# Patient Record
Sex: Female | Born: 1950
Health system: Southern US, Community
[De-identification: ages and names within clinical notes are randomized; demographics above are authoritative.]

## PROBLEM LIST (undated history)

## (undated) DIAGNOSIS — R0789 Other chest pain: Secondary | ICD-10-CM

## (undated) DIAGNOSIS — R0609 Other forms of dyspnea: Secondary | ICD-10-CM

## (undated) DIAGNOSIS — E079 Disorder of thyroid, unspecified: Secondary | ICD-10-CM

## (undated) DIAGNOSIS — E785 Hyperlipidemia, unspecified: Secondary | ICD-10-CM

## (undated) DIAGNOSIS — F419 Anxiety disorder, unspecified: Secondary | ICD-10-CM

## (undated) DIAGNOSIS — Z8719 Personal history of other diseases of the digestive system: Secondary | ICD-10-CM

## (undated) DIAGNOSIS — E559 Vitamin D deficiency, unspecified: Secondary | ICD-10-CM

## (undated) DIAGNOSIS — K219 Gastro-esophageal reflux disease without esophagitis: Secondary | ICD-10-CM

## (undated) DIAGNOSIS — D696 Thrombocytopenia, unspecified: Secondary | ICD-10-CM

## (undated) DIAGNOSIS — K5790 Diverticulosis of intestine, part unspecified, without perforation or abscess without bleeding: Secondary | ICD-10-CM

## (undated) DIAGNOSIS — F32A Depression, unspecified: Secondary | ICD-10-CM

## (undated) DIAGNOSIS — M810 Age-related osteoporosis without current pathological fracture: Secondary | ICD-10-CM

## (undated) DIAGNOSIS — I10 Essential (primary) hypertension: Secondary | ICD-10-CM

## (undated) DIAGNOSIS — E039 Hypothyroidism, unspecified: Secondary | ICD-10-CM

## (undated) DIAGNOSIS — I2510 Atherosclerotic heart disease of native coronary artery without angina pectoris: Secondary | ICD-10-CM

## (undated) DIAGNOSIS — F329 Major depressive disorder, single episode, unspecified: Secondary | ICD-10-CM

## (undated) DIAGNOSIS — E538 Deficiency of other specified B group vitamins: Secondary | ICD-10-CM

## (undated) DIAGNOSIS — M1711 Unilateral primary osteoarthritis, right knee: Secondary | ICD-10-CM

## (undated) DIAGNOSIS — E063 Autoimmune thyroiditis: Secondary | ICD-10-CM

## (undated) HISTORY — PX: ABDOMINAL HYSTERECTOMY: SHX81

## (undated) HISTORY — DX: Depression, unspecified: F32.A

## (undated) HISTORY — PX: TONSILLECTOMY: SUR1361

## (undated) HISTORY — PX: OOPHORECTOMY: SHX86

## (undated) HISTORY — DX: Anxiety disorder, unspecified: F41.9

## (undated) HISTORY — DX: Age-related osteoporosis without current pathological fracture: M81.0

## (undated) HISTORY — PX: TUBAL LIGATION: SHX77

## (undated) HISTORY — PX: COLONOSCOPY: SHX174

## (undated) HISTORY — DX: Hyperlipidemia, unspecified: E78.5

## (undated) HISTORY — DX: Gastro-esophageal reflux disease without esophagitis: K21.9

## (undated) HISTORY — DX: Disorder of thyroid, unspecified: E07.9

## (undated) HISTORY — DX: Major depressive disorder, single episode, unspecified: F32.9

---
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
## 1979-06-25 HISTORY — PX: TUBAL LIGATION: SHX77

## 1999-12-02 HISTORY — PX: TOTAL ABDOMINAL HYSTERECTOMY W/ BILATERAL SALPINGOOPHORECTOMY: SHX83

## 2002-12-01 DIAGNOSIS — E785 Hyperlipidemia, unspecified: Secondary | ICD-10-CM

## 2002-12-01 HISTORY — DX: Hyperlipidemia, unspecified: E78.5

## 2004-12-18 ENCOUNTER — Ambulatory Visit: Payer: Self-pay | Admitting: Internal Medicine

## 2005-02-07 ENCOUNTER — Ambulatory Visit: Payer: Self-pay | Admitting: Internal Medicine

## 2005-02-14 ENCOUNTER — Ambulatory Visit: Payer: Self-pay

## 2005-02-14 HISTORY — PX: COLONOSCOPY: SHX174

## 2006-02-17 ENCOUNTER — Ambulatory Visit: Payer: Self-pay | Admitting: Internal Medicine

## 2007-02-25 ENCOUNTER — Ambulatory Visit: Payer: Self-pay | Admitting: Internal Medicine

## 2008-04-13 ENCOUNTER — Ambulatory Visit: Payer: Self-pay | Admitting: Internal Medicine

## 2008-09-15 ENCOUNTER — Ambulatory Visit: Payer: Self-pay | Admitting: Internal Medicine

## 2008-09-29 ENCOUNTER — Ambulatory Visit: Payer: Self-pay | Admitting: Internal Medicine

## 2009-04-25 ENCOUNTER — Ambulatory Visit: Payer: Self-pay | Admitting: Internal Medicine

## 2010-04-29 ENCOUNTER — Ambulatory Visit: Payer: Self-pay | Admitting: Internal Medicine

## 2011-04-14 ENCOUNTER — Ambulatory Visit: Payer: Self-pay | Admitting: Cardiology

## 2011-05-02 ENCOUNTER — Ambulatory Visit: Payer: Self-pay | Admitting: Internal Medicine

## 2012-05-04 ENCOUNTER — Ambulatory Visit: Payer: Self-pay | Admitting: Internal Medicine

## 2013-05-11 ENCOUNTER — Ambulatory Visit: Payer: Self-pay | Admitting: Internal Medicine

## 2014-05-13 DIAGNOSIS — F419 Anxiety disorder, unspecified: Secondary | ICD-10-CM | POA: Insufficient documentation

## 2014-05-13 DIAGNOSIS — F32A Depression, unspecified: Secondary | ICD-10-CM | POA: Insufficient documentation

## 2014-05-13 DIAGNOSIS — E063 Autoimmune thyroiditis: Secondary | ICD-10-CM | POA: Insufficient documentation

## 2014-05-13 DIAGNOSIS — E039 Hypothyroidism, unspecified: Secondary | ICD-10-CM | POA: Insufficient documentation

## 2014-05-13 DIAGNOSIS — M810 Age-related osteoporosis without current pathological fracture: Secondary | ICD-10-CM | POA: Insufficient documentation

## 2014-05-13 DIAGNOSIS — I10 Essential (primary) hypertension: Secondary | ICD-10-CM | POA: Insufficient documentation

## 2014-05-13 DIAGNOSIS — R0789 Other chest pain: Secondary | ICD-10-CM | POA: Insufficient documentation

## 2014-05-13 DIAGNOSIS — E785 Hyperlipidemia, unspecified: Secondary | ICD-10-CM | POA: Insufficient documentation

## 2014-06-23 ENCOUNTER — Ambulatory Visit: Payer: Self-pay | Admitting: Internal Medicine

## 2015-07-30 ENCOUNTER — Other Ambulatory Visit: Payer: Self-pay | Admitting: Internal Medicine

## 2015-07-30 DIAGNOSIS — Z1231 Encounter for screening mammogram for malignant neoplasm of breast: Secondary | ICD-10-CM

## 2015-07-31 ENCOUNTER — Ambulatory Visit
Admission: RE | Admit: 2015-07-31 | Discharge: 2015-07-31 | Disposition: A | Discharge status: Home / Self Care | Payer: Self-pay | Source: Ambulatory Visit | Attending: Internal Medicine | Admitting: Internal Medicine

## 2015-07-31 DIAGNOSIS — Z1231 Encounter for screening mammogram for malignant neoplasm of breast: Secondary | ICD-10-CM | POA: Insufficient documentation

## 2016-07-25 ENCOUNTER — Other Ambulatory Visit: Payer: Self-pay | Admitting: Internal Medicine

## 2016-07-25 DIAGNOSIS — Z1231 Encounter for screening mammogram for malignant neoplasm of breast: Secondary | ICD-10-CM

## 2016-08-08 ENCOUNTER — Ambulatory Visit: Payer: Self-pay

## 2016-08-15 ENCOUNTER — Ambulatory Visit
Admission: RE | Admit: 2016-08-15 | Discharge: 2016-08-15 | Disposition: A | Discharge status: Home / Self Care | Payer: BLUE CROSS/BLUE SHIELD | Source: Ambulatory Visit | Attending: Internal Medicine | Admitting: Internal Medicine

## 2016-08-15 DIAGNOSIS — Z1231 Encounter for screening mammogram for malignant neoplasm of breast: Secondary | ICD-10-CM

## 2017-09-11 ENCOUNTER — Other Ambulatory Visit: Payer: Self-pay | Admitting: Internal Medicine

## 2017-09-11 DIAGNOSIS — M810 Age-related osteoporosis without current pathological fracture: Secondary | ICD-10-CM | POA: Diagnosis not present

## 2017-09-11 DIAGNOSIS — Z79899 Other long term (current) drug therapy: Secondary | ICD-10-CM | POA: Diagnosis not present

## 2017-09-11 DIAGNOSIS — K219 Gastro-esophageal reflux disease without esophagitis: Secondary | ICD-10-CM | POA: Diagnosis not present

## 2017-09-11 DIAGNOSIS — E063 Autoimmune thyroiditis: Secondary | ICD-10-CM | POA: Diagnosis not present

## 2017-09-11 DIAGNOSIS — E038 Other specified hypothyroidism: Secondary | ICD-10-CM | POA: Diagnosis not present

## 2017-09-11 DIAGNOSIS — E039 Hypothyroidism, unspecified: Secondary | ICD-10-CM | POA: Diagnosis not present

## 2017-09-11 DIAGNOSIS — F411 Generalized anxiety disorder: Secondary | ICD-10-CM | POA: Diagnosis not present

## 2017-09-11 DIAGNOSIS — Z1231 Encounter for screening mammogram for malignant neoplasm of breast: Secondary | ICD-10-CM | POA: Diagnosis not present

## 2017-09-11 DIAGNOSIS — E782 Mixed hyperlipidemia: Secondary | ICD-10-CM | POA: Diagnosis not present

## 2017-09-11 DIAGNOSIS — I10 Essential (primary) hypertension: Secondary | ICD-10-CM | POA: Diagnosis not present

## 2017-09-11 DIAGNOSIS — Z1211 Encounter for screening for malignant neoplasm of colon: Secondary | ICD-10-CM | POA: Diagnosis not present

## 2017-10-09 ENCOUNTER — Ambulatory Visit
Admission: RE | Admit: 2017-10-09 | Discharge: 2017-10-09 | Disposition: A | Discharge status: Home / Self Care | Payer: PPO | Source: Ambulatory Visit | Attending: Internal Medicine | Admitting: Internal Medicine

## 2017-10-09 DIAGNOSIS — Z1231 Encounter for screening mammogram for malignant neoplasm of breast: Secondary | ICD-10-CM | POA: Insufficient documentation

## 2017-10-30 DIAGNOSIS — R591 Generalized enlarged lymph nodes: Secondary | ICD-10-CM | POA: Diagnosis not present

## 2018-01-19 DIAGNOSIS — H25813 Combined forms of age-related cataract, bilateral: Secondary | ICD-10-CM | POA: Diagnosis not present

## 2018-01-29 DIAGNOSIS — Z1211 Encounter for screening for malignant neoplasm of colon: Secondary | ICD-10-CM | POA: Diagnosis not present

## 2018-01-29 DIAGNOSIS — D696 Thrombocytopenia, unspecified: Secondary | ICD-10-CM | POA: Diagnosis not present

## 2018-02-10 DIAGNOSIS — L728 Other follicular cysts of the skin and subcutaneous tissue: Secondary | ICD-10-CM | POA: Diagnosis not present

## 2018-02-12 ENCOUNTER — Inpatient Hospital Stay: Payer: PPO | Attending: Oncology | Admitting: Oncology

## 2018-02-12 ENCOUNTER — Other Ambulatory Visit: Payer: Self-pay

## 2018-02-12 ENCOUNTER — Inpatient Hospital Stay: Payer: PPO

## 2018-02-12 ENCOUNTER — Encounter: Payer: Self-pay | Admitting: Oncology

## 2018-02-12 VITALS — BP 125/85 | HR 69 | Temp 96.9°F | Resp 18 | Ht 67.72 in | Wt 170.1 lb

## 2018-02-12 DIAGNOSIS — D696 Thrombocytopenia, unspecified: Secondary | ICD-10-CM

## 2018-02-12 DIAGNOSIS — F329 Major depressive disorder, single episode, unspecified: Secondary | ICD-10-CM | POA: Diagnosis not present

## 2018-02-12 DIAGNOSIS — E079 Disorder of thyroid, unspecified: Secondary | ICD-10-CM | POA: Diagnosis not present

## 2018-02-12 DIAGNOSIS — F419 Anxiety disorder, unspecified: Secondary | ICD-10-CM | POA: Insufficient documentation

## 2018-02-12 DIAGNOSIS — K219 Gastro-esophageal reflux disease without esophagitis: Secondary | ICD-10-CM | POA: Insufficient documentation

## 2018-02-12 DIAGNOSIS — E785 Hyperlipidemia, unspecified: Secondary | ICD-10-CM | POA: Diagnosis not present

## 2018-02-12 DIAGNOSIS — Z79899 Other long term (current) drug therapy: Secondary | ICD-10-CM | POA: Diagnosis not present

## 2018-02-12 LAB — CBC WITH DIFFERENTIAL/PLATELET
BASOS PCT: 1 %
Basophils Absolute: 0 10*3/uL (ref 0–0.1)
Eosinophils Absolute: 0.1 10*3/uL (ref 0–0.7)
Eosinophils Relative: 2 %
HEMATOCRIT: 40.3 % (ref 35.0–47.0)
Hemoglobin: 13.7 g/dL (ref 12.0–16.0)
LYMPHS PCT: 36 %
Lymphs Abs: 1.7 10*3/uL (ref 1.0–3.6)
MCH: 30.2 pg (ref 26.0–34.0)
MCHC: 33.9 g/dL (ref 32.0–36.0)
MCV: 89 fL (ref 80.0–100.0)
MONO ABS: 0.3 10*3/uL (ref 0.2–0.9)
MONOS PCT: 7 %
NEUTROS ABS: 2.6 10*3/uL (ref 1.4–6.5)
Neutrophils Relative %: 54 %
Platelets: 142 10*3/uL — ABNORMAL LOW (ref 150–440)
RBC: 4.52 MIL/uL (ref 3.80–5.20)
RDW: 14.1 % (ref 11.5–14.5)
WBC: 4.8 10*3/uL (ref 3.6–11.0)

## 2018-02-12 LAB — LACTATE DEHYDROGENASE: LDH: 143 U/L (ref 98–192)

## 2018-02-12 LAB — COMPREHENSIVE METABOLIC PANEL
ALBUMIN: 4.1 g/dL (ref 3.5–5.0)
ALK PHOS: 66 U/L (ref 38–126)
ALT: 22 U/L (ref 14–54)
ANION GAP: 8 (ref 5–15)
AST: 20 U/L (ref 15–41)
BUN: 25 mg/dL — ABNORMAL HIGH (ref 6–20)
CALCIUM: 9.4 mg/dL (ref 8.9–10.3)
CO2: 27 mmol/L (ref 22–32)
Chloride: 105 mmol/L (ref 101–111)
Creatinine, Ser: 0.82 mg/dL (ref 0.44–1.00)
GFR calc Af Amer: >60 mL/min (ref >60)
GFR calc non Af Amer: >60 mL/min (ref >60)
GLUCOSE: 85 mg/dL (ref 65–99)
Potassium: 3.6 mmol/L (ref 3.5–5.1)
SODIUM: 140 mmol/L (ref 135–145)
Total Bilirubin: 1 mg/dL (ref 0.3–1.2)
Total Protein: 7.3 g/dL (ref 6.5–8.1)

## 2018-02-12 LAB — FOLATE: FOLATE: 10.7 ng/mL (ref >5.9)

## 2018-02-12 LAB — VITAMIN B12: VITAMIN B 12: 142 pg/mL — AB (ref 180–914)

## 2018-02-12 LAB — TSH: TSH: 0.018 u[IU]/mL — ABNORMAL LOW (ref 0.350–4.500)

## 2018-02-12 NOTE — Progress Notes (Signed)
Here for new pt evaluation.  

## 2018-02-13 LAB — HEPATITIS PANEL, ACUTE
HCV Ab: <0.1 s/co ratio (ref 0.0–0.9)
HEP B C IGM: NEGATIVE
Hep A IgM: NEGATIVE
Hepatitis B Surface Ag: NEGATIVE

## 2018-02-13 LAB — HIV ANTIBODY (ROUTINE TESTING W REFLEX): HIV Screen 4th Generation wRfx: NONREACTIVE

## 2018-02-14 ENCOUNTER — Encounter: Payer: Self-pay | Admitting: Oncology

## 2018-02-14 NOTE — Progress Notes (Addendum)
Hematology/Oncology Consult note [facility] Telephone:([phone] Fax:([phone]   Patient Care Team: Idelle Crouch, MD as PCP - General (Internal Medicine)  REFERRING PROVIDER: Idelle Crouch, MD CHIEF COMPLAINTS/PURPOSE OF CONSULTATION:  Evaluation of low platelet.   HISTORY OF PRESENTING ILLNESS:  Audrey Finley is a  67 y.o.  female with PMH listed below who was referred to me for evaluation of thrombocytopenia. Patient recently had lab work done which revealed thrombocytopenia, platelet counts 146,000.  Reviewed patient's previous labs, his platelet count was 125,000 in march 2018, 129,0000 to 138,000 from 2016-2018 .  Patient denies fatigue, weight loss, easy bruising, hematochezia, hemoptysis  She denies fatigue, weight loss, easy bruising, hematochezia, hemoptysis.  Alcohol Use: socially 2 glassed of wine per week.     Review of Systems  Constitutional: Negative for chills, fever, malaise/fatigue and weight loss.  HENT: Negative for nosebleeds.   Eyes: Negative for blurred vision and double vision.  Respiratory: Negative for cough and sputum production.   Cardiovascular: Negative for chest pain and palpitations.  Gastrointestinal: Negative for abdominal pain, blood in stool, nausea and vomiting.  Genitourinary: Negative for dysuria, frequency and urgency.  Musculoskeletal: Negative for myalgias and neck pain.  Skin: Negative for rash.  Neurological: Negative for dizziness, tremors and headaches.  Endo/Heme/Allergies: Does not bruise/bleed easily.  Psychiatric/Behavioral: Negative for depression. The patient is not nervous/anxious.     MEDICAL HISTORY:  Past Medical History:  Diagnosis Date  . Anxiety   . Depression   . GERD (gastroesophageal reflux disease)   . Hyperlipidemia 2004  . Osteoporosis   . Thyroid disease     SURGICAL HISTORY: Past Surgical History:  Procedure Laterality Date  . ABDOMINAL HYSTERECTOMY       SOCIAL HISTORY: Social History   Socioeconomic History  . Marital status: Married    Spouse name: Not on file  . Number of children: Not on file  . Years of education: Not on file  . Highest education level: Not on file  Social Needs  . Financial resource strain: Not on file  . Food insecurity - worry: Not on file  . Food insecurity - inability: Not on file  . Transportation needs - medical: Not on file  . Transportation needs - non-medical: Not on file  Occupational History  . Not on file  Tobacco Use  . Smoking status: Never Smoker  . Smokeless tobacco: Never Used  Substance and Sexual Activity  . Alcohol use: Yes    Comment: socially -2 glasses of wine per wk  . Drug use: No  . Sexual activity: Yes    Birth control/protection: Spermicide  Other Topics Concern  . Not on file  Social History Narrative  . Not on file    FAMILY HISTORY: Family History  Problem Relation Age of Onset  . Diabetes Mother   . Coronary artery disease Mother   . CVA Mother   . Coronary artery disease Father   . Kidney disease Father   . Hyperlipidemia Sister   . Breast cancer Neg Hx     ALLERGIES:  has No Known Allergies.  MEDICATIONS:  Current Outpatient Medications  Medication Sig Dispense Refill  . ALPRAZolam (XANAX) 0.25 MG tablet Take 0.25 mg by mouth 2 (two) times daily as needed.     Marland Kitchen atorvastatin (LIPITOR) 20 MG tablet Take by mouth daily at 6 PM.     . Cholecalciferol (VITAMIN D3) 1000 units CAPS Take by mouth every morning.     Marland Kitchen  levothyroxine (SYNTHROID, LEVOTHROID) 100 MCG tablet Take 100 mcg by mouth daily before breakfast.     . omeprazole (PRILOSEC) 20 MG capsule Take 20 mg by mouth daily.    . Potassium 99 MG TABS Take by mouth every morning.     . raloxifene (EVISTA) 60 MG tablet Take by mouth daily.      No current facility-administered medications for this visit.      PHYSICAL EXAMINATION: ECOG PERFORMANCE STATUS: 0 - Asymptomatic Vitals:   02/12/18  1415  BP: 125/85  Pulse: 69  Resp: 18  Temp: (!) 96.9 F (36.1 C)   Filed Weights   02/12/18 1415  Weight: 170 lb 1.6 oz (77.2 kg)    Physical Exam  Constitutional: She is oriented to person, place, and time and well-developed, well-nourished, and in no distress. No distress.  HENT:  Head: Normocephalic and atraumatic.  Mouth/Throat: No oropharyngeal exudate.  Eyes: Conjunctivae and EOM are normal. Pupils are equal, round, and reactive to light. No scleral icterus.  Neck: Normal range of motion. Neck supple.  Cardiovascular: Normal rate and regular rhythm.  No murmur heard. Pulmonary/Chest: Breath sounds normal.  Abdominal: Soft. Bowel sounds are normal. She exhibits no distension. There is no rebound and no guarding.  Musculoskeletal: Normal range of motion. She exhibits no edema or deformity.  Lymphadenopathy:    She has no cervical adenopathy.  Neurological: She is alert and oriented to person, place, and time. Coordination normal.  Skin: Skin is warm and dry.  Psychiatric: Affect and judgment normal.     LABORATORY DATA:  I have reviewed the data as listed Lab Results  Component Value Date   WBC 4.8 02/12/2018   HGB 13.7 02/12/2018   HCT 40.3 02/12/2018   MCV 89.0 02/12/2018   PLT 142 (L) 02/12/2018   Recent Labs    02/12/18 1525  NA 140  K 3.6  CL 105  CO2 27  GLUCOSE 85  BUN 25*  CREATININE 0.82  CALCIUM 9.4  GFRNONAA >60  GFRAA >60  PROT 7.3  ALBUMIN 4.1  AST 20  ALT 22  ALKPHOS 66  BILITOT 1.0       ASSESSMENT & PLAN:  1. Thrombocytopenia ([employer])   For the work up of patient's thrombocytopenia, I recommend checking CBC;CMP, LDH; pathology smear review, folate, Vitamin B12, hepatitis, HIV, ANA,  and monoclonal gammopathy workup. Will also check ultrasound of the abdomen.  Also, discussed with the patient that if no clear etiology found- bone marrow biopsy would be suggested. Currently await for the above workup.  # Alcohol cessation  discussed.  # Patient follow-up with me in approximately 2 weeks to review the above results.  All questions were answered. The patient knows to call the clinic with any problems questions or concerns.  Return of visit: 2-3 weeks.  Thank you for this kind referral and the opportunity to participate in the care of this patient. A copy of today's note is routed to referring provider    Earlie Server, MD, PhD Hematology Oncology [facility] Pager- [phone]

## 2018-02-15 LAB — PROTEIN ELECTROPHORESIS, SERUM
A/G RATIO SPE: 1.4 (ref 0.7–1.7)
ALBUMIN ELP: 4 g/dL (ref 2.9–4.4)
ALPHA-1-GLOBULIN: 0.2 g/dL (ref 0.0–0.4)
Alpha-2-Globulin: 0.7 g/dL (ref 0.4–1.0)
Beta Globulin: 0.9 g/dL (ref 0.7–1.3)
GLOBULIN, TOTAL: 2.9 g/dL (ref 2.2–3.9)
Gamma Globulin: 1.1 g/dL (ref 0.4–1.8)
TOTAL PROTEIN ELP: 6.9 g/dL (ref 6.0–8.5)

## 2018-02-19 ENCOUNTER — Ambulatory Visit
Admission: RE | Admit: 2018-02-19 | Discharge: 2018-02-19 | Disposition: A | Discharge status: Home / Self Care | Payer: PPO | Source: Ambulatory Visit | Attending: Oncology | Admitting: Oncology

## 2018-02-19 DIAGNOSIS — K829 Disease of gallbladder, unspecified: Secondary | ICD-10-CM | POA: Diagnosis not present

## 2018-02-19 DIAGNOSIS — D696 Thrombocytopenia, unspecified: Secondary | ICD-10-CM | POA: Diagnosis not present

## 2018-02-23 ENCOUNTER — Telehealth: Payer: Self-pay | Admitting: *Deleted

## 2018-02-23 NOTE — Telephone Encounter (Signed)
Patient called asking if results are back, Everything but Flow is back and she has follow up 4/5  CLINICAL DATA:  Thrombocytopenia.  EXAM: ABDOMEN ULTRASOUND COMPLETE  COMPARISON:  None.  FINDINGS: Gallbladder: Small amount of nonshadowing sludge. No stones. No wall thickening. No Murphy sign.  Common bile duct: Diameter: 6 mm, within normal limits.  Liver: No focal lesion identified. Within normal limits in parenchymal echogenicity. Portal vein is patent on color Doppler imaging with normal direction of blood flow towards the liver.  IVC: No abnormality visualized.  Pancreas: Visualized portion unremarkable.  Spleen: 11 cm in length. Within normal limits in size. No focal lesion.  Right Kidney: Length: 10.9 cm. Echogenicity within normal limits. No mass or hydronephrosis visualized.  Left Kidney: Length: 12.1 cm. Echogenicity within normal limits. No mass or hydronephrosis visualized.  Abdominal aorta: No aneurysm visualized.  Other findings: No ascites  IMPRESSION: The spleen is at the upper limits of normal in size. No focal lesion.  Small amount of sludge in the gallbladder, probably not clinically relevant.   Electronically Signed   By: Nelson Chimes M.D.   On: 02/19/2018 14:49 Contains abnormal data CBC with Differential/Platelet  Order: [id number]  Status:  Final result  Visible to patient:  No (Not Released)  Next appt:  03/05/2018 at 10:45 AM in Oncology Earlie Server, MD)  Dx:  Thrombocytopenia (HCC)   Ref Range & Units 11d ago  WBC 3.6 - 11.0 K/uL 4.8   RBC 3.80 - 5.20 MIL/uL 4.52   Hemoglobin 12.0 - 16.0 g/dL 13.7   HCT 35.0 - 47.0 % 40.3   MCV 80.0 - 100.0 fL 89.0   MCH 26.0 - 34.0 pg 30.2   MCHC 32.0 - 36.0 g/dL 33.9   RDW 11.5 - 14.5 % 14.1   Platelets 150 - 440 K/uL 142Low    Neutrophils Relative % % 54   Neutro Abs 1.4 - 6.5 K/uL 2.6   Lymphocytes Relative % 36   Lymphs Abs 1.0 - 3.6 K/uL 1.7   Monocytes Relative % 7    Monocytes Absolute 0.2 - 0.9 K/uL 0.3   Eosinophils Relative % 2   Eosinophils Absolute 0 - 0.7 K/uL 0.1   Basophils Relative % 1   Basophils Absolute 0 - 0.1 K/uL 0.0   Comment: Performed at [facility], [address], [place], [place] [address]  Resulting Agency  [facility] CLIN LAB      Specimen Collected: 02/12/18 15:25  Last Resulted: 02/12/18 15:37     Lab Flowsheet    Order Details    View Encounter    Lab and Collection Details    Routing    Result History          Other Results from 02/12/2018   Contains abnormal data TSH  Order: [id number]   Status:  Final result  Visible to patient:  No (Not Released)  Next appt:  03/05/2018 at 10:45 AM in Oncology Earlie Server, MD)  Dx:  Thrombocytopenia (HCC)   Ref Range & Units 11d ago  TSH 0.350 - 4.500 uIU/mL 0.018Low    Comment: Performed by a 3rd Generation assay with a functional sensitivity of <=0.01 uIU/mL.  Performed at [facility], [address] [place], [place] [address]   Resulting Agency  [facility] CLIN LAB      Specimen Collected: 02/12/18 15:25  Last Resulted: 02/12/18 16:37     Lab Flowsheet    Order Details    View Encounter  Lab and Collection Details    Routing    Result History            Folate  Order: [id number]   Status:  Final result  Visible to patient:  No (Not Released)  Next appt:  03/05/2018 at 10:45 AM in Oncology Earlie Server, MD)  Dx:  Thrombocytopenia ([place])   Ref Range & Units 11d ago  Folate >5.9 ng/mL 10.7   Comment: Performed at [facility], [address] [place], [place] [address]  Resulting Agency  [facility] CLIN LAB      Specimen Collected: 02/12/18 15:25  Last Resulted: 02/12/18 16:37     Lab Flowsheet    Order Details    View Encounter    Lab and Collection Details    Routing    Result History            HIV antibody  Order: [id number]   Status:  Final result  Visible to patient:  No (Not Released)  Next appt:  03/05/2018 at  10:45 AM in Oncology Earlie Server, MD)  Dx:  Thrombocytopenia ([place])   Ref Range & Units 11d ago  HIV Screen 4th Generation wRfx Non Reactive Non Reactive   Comment: (NOTE)  Performed At: [facility]  [place], [place] [id number]  Rush Farmer MD [id number]  Performed at [facility], [address], [place], [place]  [address]   Resulting Agency  [facility] CLIN LAB      Specimen Collected: 02/12/18 15:25  Last Resulted: 02/13/18 07:44     Lab Flowsheet    Order Details    View Encounter    Lab and Collection Details    Routing    Result History            Lactate dehydrogenase  Order: [id number]   Status:  Final result  Visible to patient:  No (Not Released)  Next appt:  03/05/2018 at 10:45 AM in Oncology Earlie Server, MD)  Dx:  Thrombocytopenia ([place])   Ref Range & Units 11d ago  LDH 98 - 192 U/L 143   Comment: Performed at [facility], [address] [place], [place] [address]  Resulting Agency  [facility] CLIN LAB      Specimen Collected: 02/12/18 15:25  Last Resulted: 02/12/18 16:09     Lab Flowsheet    Order Details    View Encounter    Lab and Collection Details    Routing    Result History            Hepatitis panel, acute  Order: [id number]   Status:  Final result  Visible to patient:  No (Not Released)  Next appt:  03/05/2018 at 10:45 AM in Oncology Earlie Server, MD)  Dx:  Thrombocytopenia (HCC)   Ref Range & Units 11d ago  Hepatitis B Surface Ag Negative Negative   HCV Ab 0.0 - 0.9 s/co ratio <0.1   Comment: (NOTE)                  Negative:   < 0.8                Indeterminate: 0.8 - 0.9                  Positive:   > 0.9  The CDC recommends that a positive HCV antibody result  be followed up with a HCV Nucleic Acid Amplification  test (007121).  Performed At: [facility]  [place], [place] [id number]  Rush Farmer MD  [id number]   Hep A IgM Negative Negative   Hep B C IgM Negative Negative   Comment: Performed at [facility], [address] [place], [place] [address]  Resulting Agency  [facility] CLIN LAB      Specimen Collected: 02/12/18 15:25  Last Resulted: 02/13/18 06:40     Lab Flowsheet    Order Details    View Encounter    Lab and Collection Details    Routing    Result History            Contains abnormal data Comprehensive metabolic panel  Order: [id number]   Status:  Final result  Visible to patient:  No (Not Released)  Next appt:  03/05/2018 at 10:45 AM in Oncology Earlie Server, MD)  Dx:  Thrombocytopenia (HCC)   Ref Range & Units 11d ago  Sodium 135 - 145 mmol/L 140   Potassium 3.5 - 5.1 mmol/L 3.6   Chloride 101 - 111 mmol/L 105   CO2 22 - 32 mmol/L 27   Glucose, Bld 65 - 99 mg/dL 85   BUN 6 - 20 mg/dL 25High    Creatinine, Ser 0.44 - 1.00 mg/dL 0.82   Calcium 8.9 - 10.3 mg/dL 9.4   Total Protein 6.5 - 8.1 g/dL 7.3   Albumin 3.5 - 5.0 g/dL 4.1   AST 15 - 41 U/L 20   ALT 14 - 54 U/L 22   Alkaline Phosphatase 38 - 126 U/L 66   Total Bilirubin 0.3 - 1.2 mg/dL 1.0   GFR calc non Af Amer >60 mL/min >60   GFR calc Af Amer >60 mL/min >60   Comment: (NOTE)  The eGFR has been calculated using the CKD EPI equation.  This calculation has not been validated in all clinical situations.  eGFR's persistently <60 mL/min signify possible Chronic Kidney  Disease.   Anion gap 5 - 15 8   Comment: Performed at [facility], [address] [place], [place] [address]  Resulting Agency  [facility] CLIN LAB      Specimen Collected: 02/12/18 15:25  Last Resulted: 02/12/18 16:09     Lab Flowsheet    Order Details    View Encounter    Lab and Collection Details    Routing    Result History            Contains abnormal data Vitamin B12  Order: [id number]   Status:  Final result  Visible to patient:  No (Not Released)  Next appt:  03/05/2018 at 10:45 AM in  Oncology Earlie Server, MD)  Dx:  Thrombocytopenia (HCC)   Ref Range & Units 11d ago  Vitamin B-12 180 - 914 pg/mL 142Low    Comment: (NOTE)  This assay is not validated for testing neonatal or  myeloproliferative syndrome specimens for Vitamin B12 levels.  Performed at [facility], [place] [address] [place], [place]  [address]   Resulting Agency  [facility] CLIN LAB      Specimen Collected: 02/12/18 15:05  Last Resulted: 02/12/18 20:38     Lab Flowsheet    Order Details    View Encounter    Lab and Collection Details    Routing    Result History            Protein electrophoresis, serum  Order: [id number]   Status:  Edited Result - FINAL  Visible to patient:  No (Not Released)  Next appt:  03/05/2018 at 10:45 AM in Oncology Earlie Server, MD)  Dx:  Thrombocytopenia (HCC)   Ref Range & Units 11d ago  Total Protein ELP 6.0 - 8.5 g/dL 6.9   Albumin ELP 2.9 - 4.4 g/dL 4.0   Alpha-1-Globulin 0.0 - 0.4 g/dL 0.2   Alpha-2-Globulin 0.4 - 1.0 g/dL 0.7   Beta Globulin 0.7 - 1.3 g/dL 0.9   Gamma Globulin 0.4 - 1.8 g/dL 1.1   M-Spike, % Not Observed g/dL Not Observed   SPE Interp.  Comment   Comment: (NOTE)  The SPE pattern appears essentially unremarkable. Evidence of  monoclonal protein is not apparent.  Performed At: [facility]  [place], [place] [id number]  Rush Farmer MD [id number]   Comment  Comment   Comment: (NOTE)  Protein electrophoresis scan will follow via computer, mail, or  courier delivery.   GLOBULIN, TOTAL 2.2 - 3.9 g/dL 2.9 VC  A/G Ratio 0.7 - 1.7 1.4 VC  Comment: Performed at [facility], [address] [place], [place] [address]  Resulting Agency  [facility] CLIN LAB      Specimen Collected: 02/12/18 15:05  Last Resulted: 02/15/18 14:38     Lab Flowsheet    Order Details    View Encounter    Lab and Collection Details    Routing    Result History      VC=Value has a corrected status         Flow cytometry  panel-leukemia/lymphoma work-up  Order: [id number]   Status:  In process  Visible to patient:  No (Not Released)  Next appt:  03/05/2018 at 10:45 AM in Oncology Earlie Server, MD)  Dx:  Thrombocytopenia ([place])      Specimen Collected: 02/12/18 15:00  Last Resulted: 02/12/18 15:14

## 2018-02-23 NOTE — Telephone Encounter (Signed)
Left vm to return my call

## 2018-02-25 LAB — COMP PANEL: LEUKEMIA/LYMPHOMA

## 2018-02-28 ENCOUNTER — Other Ambulatory Visit: Payer: Self-pay | Admitting: Oncology

## 2018-02-28 DIAGNOSIS — E538 Deficiency of other specified B group vitamins: Secondary | ICD-10-CM

## 2018-03-02 DIAGNOSIS — I10 Essential (primary) hypertension: Secondary | ICD-10-CM | POA: Diagnosis not present

## 2018-03-02 DIAGNOSIS — E782 Mixed hyperlipidemia: Secondary | ICD-10-CM | POA: Diagnosis not present

## 2018-03-02 DIAGNOSIS — F411 Generalized anxiety disorder: Secondary | ICD-10-CM | POA: Diagnosis not present

## 2018-03-02 DIAGNOSIS — E079 Disorder of thyroid, unspecified: Secondary | ICD-10-CM | POA: Diagnosis not present

## 2018-03-02 DIAGNOSIS — Z79899 Other long term (current) drug therapy: Secondary | ICD-10-CM | POA: Diagnosis not present

## 2018-03-02 DIAGNOSIS — F419 Anxiety disorder, unspecified: Secondary | ICD-10-CM | POA: Diagnosis not present

## 2018-03-02 DIAGNOSIS — Z0000 Encounter for general adult medical examination without abnormal findings: Secondary | ICD-10-CM | POA: Diagnosis not present

## 2018-03-02 DIAGNOSIS — F329 Major depressive disorder, single episode, unspecified: Secondary | ICD-10-CM | POA: Diagnosis not present

## 2018-03-05 ENCOUNTER — Inpatient Hospital Stay: Payer: PPO | Attending: Oncology | Admitting: Oncology

## 2018-03-05 ENCOUNTER — Encounter: Payer: Self-pay | Admitting: Oncology

## 2018-03-05 ENCOUNTER — Other Ambulatory Visit: Payer: Self-pay

## 2018-03-05 ENCOUNTER — Inpatient Hospital Stay: Payer: PPO

## 2018-03-05 VITALS — BP 122/79 | HR 64 | Temp 97.0°F | Resp 18 | Wt 170.9 lb

## 2018-03-05 DIAGNOSIS — E785 Hyperlipidemia, unspecified: Secondary | ICD-10-CM | POA: Diagnosis not present

## 2018-03-05 DIAGNOSIS — F329 Major depressive disorder, single episode, unspecified: Secondary | ICD-10-CM | POA: Diagnosis not present

## 2018-03-05 DIAGNOSIS — F419 Anxiety disorder, unspecified: Secondary | ICD-10-CM | POA: Insufficient documentation

## 2018-03-05 DIAGNOSIS — K219 Gastro-esophageal reflux disease without esophagitis: Secondary | ICD-10-CM | POA: Diagnosis not present

## 2018-03-05 DIAGNOSIS — D696 Thrombocytopenia, unspecified: Secondary | ICD-10-CM | POA: Insufficient documentation

## 2018-03-05 DIAGNOSIS — E079 Disorder of thyroid, unspecified: Secondary | ICD-10-CM | POA: Diagnosis not present

## 2018-03-05 DIAGNOSIS — Z79899 Other long term (current) drug therapy: Secondary | ICD-10-CM | POA: Insufficient documentation

## 2018-03-05 DIAGNOSIS — E538 Deficiency of other specified B group vitamins: Secondary | ICD-10-CM | POA: Insufficient documentation

## 2018-03-05 MED ORDER — CYANOCOBALAMIN 1000 MCG/ML IJ SOLN
1000.0000 ug | Freq: Once | INTRAMUSCULAR | Status: AC
  Administered 2018-03-05: 1000 ug via INTRAMUSCULAR
  Filled 2018-03-05: qty 1

## 2018-03-05 NOTE — Progress Notes (Signed)
Here for follow up

## 2018-03-05 NOTE — Progress Notes (Signed)
Hematology/Oncology Consult note [facility] Telephone:([phone] Fax:(336) [phone]   Patient Care Team: Idelle Crouch, MD as PCP - General (Internal Medicine)  REFERRING PROVIDER: Idelle Crouch, MD CHIEF COMPLAINTS/PURPOSE OF CONSULTATION:  Evaluation of low platelet.   HISTORY OF PRESENTING ILLNESS:  Audrey Finley is a  67 y.o.  female with PMH listed below who was referred to me for evaluation of thrombocytopenia. Patient recently had lab work done which revealed thrombocytopenia, platelet counts 146,000.  Reviewed patient's previous labs, his platelet count was 125,000 in march 2018, 129,0000 to 138,000 from 2016-2018 .  Patient denies fatigue, weight loss, easy bruising, hematochezia, hemoptysis  She denies fatigue, weight loss, easy bruising, hematochezia, hemoptysis.  Alcohol Use: socially 2 glassed of wine per week.  INTERVAL HISTORY Audrey Finley is a 67 y.o. female who has above history reviewed by me today presents for follow up visit for management of thrombocytopenia.  She has had denies any acute bleeding episodes.   Review of Systems  Constitutional: Negative for chills, fever, malaise/fatigue and weight loss.  HENT: Negative for ear pain and nosebleeds.   Eyes: Negative for blurred vision and double vision.  Respiratory: Negative for cough and sputum production.   Cardiovascular: Negative for chest pain, palpitations, claudication and leg swelling.  Gastrointestinal: Negative for abdominal pain, blood in stool, nausea and vomiting.  Genitourinary: Negative for dysuria, frequency, hematuria and urgency.  Musculoskeletal: Negative for back pain, myalgias and neck pain.  Skin: Negative for rash.  Neurological: Negative for dizziness, tremors, focal weakness and headaches.  Endo/Heme/Allergies: Does not bruise/bleed easily.  Psychiatric/Behavioral: Negative for depression and substance abuse. The patient is not nervous/anxious.       MEDICAL HISTORY:  Past Medical History:  Diagnosis Date  . Anxiety   . Depression   . GERD (gastroesophageal reflux disease)   . Hyperlipidemia 2004  . Osteoporosis   . Thyroid disease     SURGICAL HISTORY: Past Surgical History:  Procedure Laterality Date  . ABDOMINAL HYSTERECTOMY      SOCIAL HISTORY: Social History   Socioeconomic History  . Marital status: Married    Spouse name: Not on file  . Number of children: Not on file  . Years of education: Not on file  . Highest education level: Not on file  Occupational History  . Not on file  Social Needs  . Financial resource strain: Not on file  . Food insecurity:    Worry: Not on file    Inability: Not on file  . Transportation needs:    Medical: Not on file    Non-medical: Not on file  Tobacco Use  . Smoking status: Never Smoker  . Smokeless tobacco: Never Used  Substance and Sexual Activity  . Alcohol use: Yes    Comment: socially -2 glasses of wine per wk  . Drug use: No  . Sexual activity: Yes    Birth control/protection: Spermicide  Lifestyle  . Physical activity:    Days per week: Not on file    Minutes per session: Not on file  . Stress: Not on file  Relationships  . Social connections:    Talks on phone: Not on file    Gets together: Not on file    Attends religious service: Not on file    Active member of club or organization: Not on file    Attends meetings of clubs or organizations: Not on file    Relationship status: Not on file  .  Intimate partner violence:    Fear of current or ex partner: Not on file    Emotionally abused: Not on file    Physically abused: Not on file    Forced sexual activity: Not on file  Other Topics Concern  . Not on file  Social History Narrative  . Not on file    FAMILY HISTORY: Family History  Problem Relation Age of Onset  . Diabetes Mother   . Coronary artery disease Mother   . CVA Mother   . Coronary artery disease Father   . Kidney disease  Father   . Hyperlipidemia Sister   . Breast cancer Neg Hx     ALLERGIES:  has No Known Allergies.  MEDICATIONS:  Current Outpatient Medications  Medication Sig Dispense Refill  . ALPRAZolam (XANAX) 0.25 MG tablet Take 0.25 mg by mouth 2 (two) times daily as needed.     Marland Kitchen atorvastatin (LIPITOR) 20 MG tablet Take by mouth daily at 6 PM.     . Cholecalciferol (VITAMIN D3) 1000 units CAPS Take by mouth every morning.     Marland Kitchen levothyroxine (SYNTHROID, LEVOTHROID) 100 MCG tablet Take by mouth.     Marland Kitchen omeprazole (PRILOSEC) 20 MG capsule Take 20 mg by mouth daily.    . Potassium 99 MG TABS Take by mouth every morning.     . raloxifene (EVISTA) 60 MG tablet Take by mouth daily.      No current facility-administered medications for this visit.      PHYSICAL EXAMINATION: ECOG PERFORMANCE STATUS: 0 - Asymptomatic Vitals:   03/05/18 1132  BP: 122/79  Pulse: 64  Resp: 18  Temp: (!) 97 F (36.1 C)   Filed Weights   03/05/18 1132  Weight: 170 lb 14.4 oz (77.5 kg)    Physical Exam  Constitutional: She is oriented to person, place, and time and well-developed, well-nourished, and in no distress. No distress.  HENT:  Head: Normocephalic and atraumatic.  Mouth/Throat: No oropharyngeal exudate.  Eyes: Pupils are equal, round, and reactive to light. Conjunctivae and EOM are normal. No scleral icterus.  Neck: Normal range of motion. Neck supple.  Cardiovascular: Normal rate and regular rhythm.  No murmur heard. Pulmonary/Chest: Breath sounds normal.  Abdominal: Soft. Bowel sounds are normal. She exhibits no distension. There is no rebound and no guarding.  Musculoskeletal: Normal range of motion. She exhibits no edema or deformity.  Lymphadenopathy:    She has no cervical adenopathy.  Neurological: She is alert and oriented to person, place, and time. Coordination normal.  Skin: Skin is warm and dry.  Psychiatric: Affect and judgment normal.     LABORATORY DATA:  I have reviewed the  data as listed Lab Results  Component Value Date   WBC 4.8 02/12/2018   HGB 13.7 02/12/2018   HCT 40.3 02/12/2018   MCV 89.0 02/12/2018   PLT 142 (L) 02/12/2018   Recent Labs    02/12/18 1525  NA 140  K 3.6  CL 105  CO2 27  GLUCOSE 85  BUN 25*  CREATININE 0.82  CALCIUM 9.4  GFRNONAA >60  GFRAA >60  PROT 7.3  ALBUMIN 4.1  AST 20  ALT 22  ALKPHOS 66  BILITOT 1.0       ASSESSMENT & PLAN:  1. Vitamin B12 deficiency   2. Thrombocytopenia ([place])   Lab results were discussed with patient.  B12 deficiency which can contribute to  [place] with patient about starting parenteral vitamin B12 injection daily 5 followed by  weekly injection x4.  First 2 due to weekly treatment due to working schedule. #B12 deficiency, will check the parietal antibody and intrinsic factor antibody. Ultrasound abdomen showed upper limit size spleen. #Increased TSH, patient follows up with primary care physician and already has thyroid medication dose adjusted.  # Patient follow-up with me in approximately 2 weeks to review the above results.  All questions were answered. The patient knows to call the clinic with any problems questions or concerns.  Return of visit: 5-6 weeks   Earlie Server, MD, PhD Hematology Oncology [facility] Pager- [phone]

## 2018-03-08 ENCOUNTER — Telehealth: Payer: Self-pay | Admitting: Oncology

## 2018-03-08 NOTE — Telephone Encounter (Signed)
theoretically ok. However, need to figure out syringes and needle? Unless her dentist has the supply. Can you call her and find out? Thanks.

## 2018-03-09 ENCOUNTER — Telehealth: Payer: Self-pay | Admitting: *Deleted

## 2018-03-09 NOTE — Telephone Encounter (Signed)
Patient is asking about getting her prescription for B 12inj Please advise

## 2018-03-10 ENCOUNTER — Other Ambulatory Visit: Payer: Self-pay | Admitting: Oncology

## 2018-03-10 MED ORDER — CYANOCOBALAMIN 1000 MCG/ML IJ SOLN: INTRAMUSCULAR | 0 refills | Status: DC

## 2018-03-10 MED ORDER — "SYRINGE/NEEDLE (DISP) 22G X 1"" 3 ML MISC": 0 refills | Status: DC

## 2018-03-12 ENCOUNTER — Ambulatory Visit: Payer: PPO

## 2018-03-12 DIAGNOSIS — D225 Melanocytic nevi of trunk: Secondary | ICD-10-CM | POA: Diagnosis not present

## 2018-03-12 DIAGNOSIS — D2262 Melanocytic nevi of left upper limb, including shoulder: Secondary | ICD-10-CM | POA: Diagnosis not present

## 2018-03-12 DIAGNOSIS — D2271 Melanocytic nevi of right lower limb, including hip: Secondary | ICD-10-CM | POA: Diagnosis not present

## 2018-03-12 DIAGNOSIS — D2272 Melanocytic nevi of left lower limb, including hip: Secondary | ICD-10-CM | POA: Diagnosis not present

## 2018-03-12 DIAGNOSIS — D2261 Melanocytic nevi of right upper limb, including shoulder: Secondary | ICD-10-CM | POA: Diagnosis not present

## 2018-03-12 DIAGNOSIS — L821 Other seborrheic keratosis: Secondary | ICD-10-CM | POA: Diagnosis not present

## 2018-03-15 ENCOUNTER — Ambulatory Visit: Payer: PPO

## 2018-03-19 ENCOUNTER — Ambulatory Visit: Payer: PPO

## 2018-03-26 ENCOUNTER — Ambulatory Visit: Payer: PPO

## 2018-04-02 ENCOUNTER — Other Ambulatory Visit: Payer: Self-pay

## 2018-04-02 ENCOUNTER — Inpatient Hospital Stay: Payer: PPO | Attending: Oncology

## 2018-04-02 DIAGNOSIS — Z79899 Other long term (current) drug therapy: Secondary | ICD-10-CM | POA: Diagnosis not present

## 2018-04-02 DIAGNOSIS — E785 Hyperlipidemia, unspecified: Secondary | ICD-10-CM | POA: Insufficient documentation

## 2018-04-02 DIAGNOSIS — D696 Thrombocytopenia, unspecified: Secondary | ICD-10-CM | POA: Insufficient documentation

## 2018-04-02 DIAGNOSIS — E079 Disorder of thyroid, unspecified: Secondary | ICD-10-CM | POA: Diagnosis not present

## 2018-04-02 DIAGNOSIS — E538 Deficiency of other specified B group vitamins: Secondary | ICD-10-CM

## 2018-04-02 DIAGNOSIS — F419 Anxiety disorder, unspecified: Secondary | ICD-10-CM | POA: Diagnosis not present

## 2018-04-02 DIAGNOSIS — F329 Major depressive disorder, single episode, unspecified: Secondary | ICD-10-CM | POA: Insufficient documentation

## 2018-04-02 DIAGNOSIS — K219 Gastro-esophageal reflux disease without esophagitis: Secondary | ICD-10-CM | POA: Diagnosis not present

## 2018-04-02 LAB — CBC WITH DIFFERENTIAL/PLATELET
Basophils Absolute: 0 10*3/uL (ref 0–0.1)
Basophils Relative: 1 %
Eosinophils Absolute: 0.1 10*3/uL (ref 0–0.7)
Eosinophils Relative: 3 %
HCT: 39.9 % (ref 35.0–47.0)
HEMOGLOBIN: 13.8 g/dL (ref 12.0–16.0)
LYMPHS ABS: 1.5 10*3/uL (ref 1.0–3.6)
LYMPHS PCT: 34 %
MCH: 30.9 pg (ref 26.0–34.0)
MCHC: 34.6 g/dL (ref 32.0–36.0)
MCV: 89.4 fL (ref 80.0–100.0)
Monocytes Absolute: 0.4 10*3/uL (ref 0.2–0.9)
Monocytes Relative: 8 %
NEUTROS PCT: 54 %
Neutro Abs: 2.4 10*3/uL (ref 1.4–6.5)
Platelets: 127 10*3/uL — ABNORMAL LOW (ref 150–440)
RBC: 4.47 MIL/uL (ref 3.80–5.20)
RDW: 13.8 % (ref 11.5–14.5)
WBC: 4.4 10*3/uL (ref 3.6–11.0)

## 2018-04-02 LAB — VITAMIN B12: Vitamin B-12: 593 pg/mL (ref 180–914)

## 2018-04-04 LAB — ANTI-PARIETAL ANTIBODY: Parietal Cell Antibody-IgG: 3.4 Units (ref 0.0–20.0)

## 2018-04-05 LAB — INTRINSIC FACTOR ANTIBODIES: Intrinsic Factor: 1 AU/mL (ref 0.0–1.1)

## 2018-04-09 ENCOUNTER — Other Ambulatory Visit: Payer: PPO

## 2018-04-12 ENCOUNTER — Encounter: Payer: Self-pay | Admitting: Oncology

## 2018-04-12 ENCOUNTER — Inpatient Hospital Stay (HOSPITAL_BASED_OUTPATIENT_CLINIC_OR_DEPARTMENT_OTHER): Payer: PPO | Admitting: Oncology

## 2018-04-12 VITALS — BP 122/83 | HR 84 | Temp 98.9°F | Resp 18 | Ht 67.72 in | Wt 174.1 lb

## 2018-04-12 DIAGNOSIS — Z79899 Other long term (current) drug therapy: Secondary | ICD-10-CM | POA: Diagnosis not present

## 2018-04-12 DIAGNOSIS — F329 Major depressive disorder, single episode, unspecified: Secondary | ICD-10-CM

## 2018-04-12 DIAGNOSIS — E538 Deficiency of other specified B group vitamins: Secondary | ICD-10-CM | POA: Diagnosis not present

## 2018-04-12 DIAGNOSIS — E785 Hyperlipidemia, unspecified: Secondary | ICD-10-CM | POA: Diagnosis not present

## 2018-04-12 DIAGNOSIS — F419 Anxiety disorder, unspecified: Secondary | ICD-10-CM | POA: Diagnosis not present

## 2018-04-12 DIAGNOSIS — K219 Gastro-esophageal reflux disease without esophagitis: Secondary | ICD-10-CM

## 2018-04-12 DIAGNOSIS — E079 Disorder of thyroid, unspecified: Secondary | ICD-10-CM | POA: Diagnosis not present

## 2018-04-12 DIAGNOSIS — D696 Thrombocytopenia, unspecified: Secondary | ICD-10-CM | POA: Diagnosis not present

## 2018-04-12 MED ORDER — VITAMIN B-12 1000 MCG PO TABS: 2000.0000 ug | ORAL_TABLET | Freq: Every day | ORAL | 3 refills | Status: DC

## 2018-04-12 NOTE — Progress Notes (Signed)
Hematology/Oncology Consult note [name] Telephone:([phone] [phone]   Patient Care Team: Idelle Crouch, MD as PCP - General (Internal Medicine)  REFERRING PROVIDER: Idelle Crouch, MD CHIEF COMPLAINTS/PURPOSE OF CONSULTATION:  Evaluation of low platelet.   HISTORY OF PRESENTING ILLNESS:  Audrey Finley is a  67 y.o.  female with PMH listed below who was referred to me for evaluation of thrombocytopenia. Patient recently had lab work done which revealed thrombocytopenia, platelet counts 146,000.  Reviewed patient's previous labs, his platelet count was 125,000 in march 2018, 129,0000 to 138,000 from 2016-2018 .  Patient denies fatigue, weight loss, easy bruising, hematochezia, hemoptysis  She denies fatigue, weight loss, easy bruising, hematochezia, hemoptysis.  Alcohol Use: socially 2 glassed of wine per week.  INTERVAL HISTORY Audrey Finley is a 67 y.o. female who has above history reviewed by me today presents for follow up visit for vitamin b12 deficiency and management of vitamithrombocytopenia.  She has had denies any acute bleeding episodes. She self administrated parental Vitamin B12 injection weekly x 5.  Reports feeling well. She increase meat intake in her diet.   Review of Systems  Constitutional: Negative for chills, fever, malaise/fatigue and weight loss.  HENT: Negative for congestion, ear discharge, ear pain, nosebleeds, sinus pain and sore throat.   Eyes: Negative for blurred vision, double vision, photophobia, pain, discharge and redness.  Respiratory: Negative for cough, hemoptysis, sputum production, shortness of breath and wheezing.   Cardiovascular: Negative for chest pain, palpitations, orthopnea, claudication and leg swelling.  Gastrointestinal: Negative for abdominal pain, blood in stool, constipation, diarrhea, heartburn, melena, nausea and vomiting.  Genitourinary: Negative for dysuria, flank pain,  frequency, hematuria and urgency.  Musculoskeletal: Negative for back pain, myalgias and neck pain.  Skin: Negative for itching and rash.  Neurological: Negative for dizziness, tingling, tremors, focal weakness, weakness and headaches.  Endo/Heme/Allergies: Negative for environmental allergies. Does not bruise/bleed easily.  Psychiatric/Behavioral: Negative for depression, hallucinations and substance abuse. The patient is not nervous/anxious.     MEDICAL HISTORY:  Past Medical History:  Diagnosis Date  . Anxiety   . Depression   . GERD (gastroesophageal reflux disease)   . Hyperlipidemia 2004  . Osteoporosis   . Thyroid disease     SURGICAL HISTORY: Past Surgical History:  Procedure Laterality Date  . ABDOMINAL HYSTERECTOMY      SOCIAL HISTORY: Social History   Socioeconomic History  . Marital status: Married    Spouse name: Not on file  . Number of children: Not on file  . Years of education: Not on file  . Highest education level: Not on file  Occupational History  . Not on file  Social Needs  . Financial resource strain: Not on file  . Food insecurity:    Worry: Not on file    Inability: Not on file  . Transportation needs:    Medical: Not on file    Non-medical: Not on file  Tobacco Use  . Smoking status: Never Smoker  . Smokeless tobacco: Never Used  Substance and Sexual Activity  . Alcohol use: Yes    Comment: socially -2 glasses of wine per wk  . Drug use: No  . Sexual activity: Yes    Birth control/protection: Spermicide  Lifestyle  . Physical activity:    Days per week: Not on file    Minutes per session: Not on file  . Stress: Not on file  Relationships  . Social connections:    Talks  on phone: Not on file    Gets together: Not on file    Attends religious service: Not on file    Active member of club or organization: Not on file    Attends meetings of clubs or organizations: Not on file    Relationship status: Not on file  . Intimate  partner violence:    Fear of current or ex partner: Not on file    Emotionally abused: Not on file    Physically abused: Not on file    Forced sexual activity: Not on file  Other Topics Concern  . Not on file  Social History Narrative  . Not on file    FAMILY HISTORY: Family History  Problem Relation Age of Onset  . Diabetes Mother   . Coronary artery disease Mother   . CVA Mother   . Coronary artery disease Father   . Kidney disease Father   . Hyperlipidemia Sister   . Breast cancer Neg Hx     ALLERGIES:  has No Known Allergies.  MEDICATIONS:  Current Outpatient Medications  Medication Sig Dispense Refill  . atorvastatin (LIPITOR) 20 MG tablet Take by mouth daily at 6 PM.     . Cholecalciferol (VITAMIN D3) 1000 units CAPS Take by mouth every morning.     . cyanocobalamin (,VITAMIN B-12,) 1000 MCG/ML injection Inject 1 mL intermuscular once weekly for 4 weeks. 1 mL 0  . levothyroxine (SYNTHROID, LEVOTHROID) 100 MCG tablet Take by mouth.     Marland Kitchen omeprazole (PRILOSEC) 20 MG capsule Take 20 mg by mouth daily.    . Potassium 99 MG TABS Take by mouth every morning.     . raloxifene (EVISTA) 60 MG tablet Take by mouth daily.     . SYRINGE-NEEDLE, DISP, 3 ML (LUER LOCK SAFETY SYRINGES) 22G X 1" 3 ML MISC Use 1 syringes with each dose of B 12 4 each 0  . ALPRAZolam (XANAX) 0.25 MG tablet Take 0.25 mg by mouth 2 (two) times daily as needed.      No current facility-administered medications for this visit.      PHYSICAL EXAMINATION: ECOG PERFORMANCE STATUS: 0 - Asymptomatic Vitals:   04/12/18 1005  BP: 122/83  Pulse: 84  Resp: 18  Temp: 98.9 F (37.2 C)   Filed Weights   04/12/18 1005  Weight: 174 lb 1.6 oz (79 kg)    Physical Exam  Constitutional: She is oriented to person, place, and time and well-developed, well-nourished, and in no distress. No distress.  HENT:  Head: Normocephalic and atraumatic.  Nose: Nose normal.  Mouth/Throat: Oropharynx is clear and moist.  No oropharyngeal exudate.  Eyes: Pupils are equal, round, and reactive to light. Conjunctivae and EOM are normal. Left eye exhibits no discharge. No scleral icterus.  Neck: Normal range of motion. Neck supple. No JVD present.  Cardiovascular: Normal rate, regular rhythm and normal heart sounds.  No murmur heard. Pulmonary/Chest: Effort normal and breath sounds normal. No respiratory distress. She has no wheezes. She has no rales. She exhibits no tenderness.  Abdominal: Soft. Bowel sounds are normal. She exhibits no distension and no mass. There is no tenderness. There is no rebound and no guarding.  Musculoskeletal: Normal range of motion. She exhibits no edema, tenderness or deformity.  Lymphadenopathy:    She has no cervical adenopathy.  Neurological: She is alert and oriented to person, place, and time. No cranial nerve deficit. She exhibits normal muscle tone. Coordination normal.  Skin: Skin is warm and  dry. No rash noted. She is not diaphoretic. No erythema.  Psychiatric: Affect and judgment normal.     LABORATORY DATA:  I have reviewed the data as listed Lab Results  Component Value Date   WBC 4.4 04/02/2018   HGB 13.8 04/02/2018   HCT 39.9 04/02/2018   MCV 89.4 04/02/2018   PLT 127 (L) 04/02/2018   Recent Labs    02/12/18 1525  NA 140  K 3.6  CL 105  CO2 27  GLUCOSE 85  BUN 25*  CREATININE 0.82  CALCIUM 9.4  GFRNONAA >60  GFRAA >60  PROT 7.3  ALBUMIN 4.1  AST 20  ALT 22  ALKPHOS 66  BILITOT 1.0     Negative flowcytometry.  US spleen upper limit.    ASSESSMENT & PLAN:  1. Vitamin B12 deficiency   2. Thrombocytopenia ([place])   # Vitamin B12 deficiency has been corrected.  parietal antibody and intrinsic factor antibody are negative.  Start on oral vitamin B12 supplement 2068mg daily.   # Thrombocytopenia: She has had negative work up including hepatitis, HIV, flowcytometry.  Can be secondary to B12 deficiency, however, count did not improve after  vitamin b12 is repleted.  UKoreashowed upper normal limit size of Spleen.  Continue close monitor, repeat cbc in 3 month. If further decline, will obtain bone marrow biopsy .   #Increased TSH, patient follows up with primary care physician and already has thyroid medication dose adjusted.  # Patient follow-up with me in approximately 3 months to review the above results.  All questions were answered. The patient knows to call the clinic with any problems questions or concerns.  Return of visit: 3 months   ZEarlie Server MD, PhD Hematology Oncology CBon [facility] Viewat ABaylor [facility] & White [facility] Center - MckinneyPager- [phone]

## 2018-04-12 NOTE — Progress Notes (Signed)
No new changes noted today 

## 2018-04-16 ENCOUNTER — Ambulatory Visit: Payer: PPO | Admitting: Oncology

## 2018-04-23 DIAGNOSIS — M530 Cervicocranial syndrome: Secondary | ICD-10-CM | POA: Diagnosis not present

## 2018-04-23 DIAGNOSIS — M5137 Other intervertebral disc degeneration, lumbosacral region: Secondary | ICD-10-CM | POA: Diagnosis not present

## 2018-04-23 DIAGNOSIS — M9901 Segmental and somatic dysfunction of cervical region: Secondary | ICD-10-CM | POA: Diagnosis not present

## 2018-04-23 DIAGNOSIS — M9903 Segmental and somatic dysfunction of lumbar region: Secondary | ICD-10-CM | POA: Diagnosis not present

## 2018-06-04 DIAGNOSIS — E079 Disorder of thyroid, unspecified: Secondary | ICD-10-CM | POA: Diagnosis not present

## 2018-06-04 DIAGNOSIS — I10 Essential (primary) hypertension: Secondary | ICD-10-CM | POA: Diagnosis not present

## 2018-06-04 DIAGNOSIS — N390 Urinary tract infection, site not specified: Secondary | ICD-10-CM | POA: Diagnosis not present

## 2018-06-04 DIAGNOSIS — Z79899 Other long term (current) drug therapy: Secondary | ICD-10-CM | POA: Diagnosis not present

## 2018-06-04 DIAGNOSIS — E782 Mixed hyperlipidemia: Secondary | ICD-10-CM | POA: Diagnosis not present

## 2018-06-18 DIAGNOSIS — E038 Other specified hypothyroidism: Secondary | ICD-10-CM | POA: Diagnosis not present

## 2018-06-18 DIAGNOSIS — I10 Essential (primary) hypertension: Secondary | ICD-10-CM | POA: Diagnosis not present

## 2018-06-18 DIAGNOSIS — E063 Autoimmune thyroiditis: Secondary | ICD-10-CM | POA: Diagnosis not present

## 2018-06-18 DIAGNOSIS — M810 Age-related osteoporosis without current pathological fracture: Secondary | ICD-10-CM | POA: Diagnosis not present

## 2018-07-01 ENCOUNTER — Encounter: Payer: Self-pay | Admitting: *Deleted

## 2018-07-02 ENCOUNTER — Encounter: Payer: Self-pay | Admitting: Anesthesiology

## 2018-07-02 ENCOUNTER — Ambulatory Visit: Payer: PPO | Admitting: Anesthesiology

## 2018-07-02 ENCOUNTER — Ambulatory Visit
Admission: RE | Admit: 2018-07-02 | Discharge: 2018-07-02 | Disposition: A | Discharge status: Home / Self Care | Payer: PPO | Source: Ambulatory Visit | Attending: Gastroenterology | Admitting: Gastroenterology

## 2018-07-02 ENCOUNTER — Encounter
Admission: RE | Disposition: A | Discharge status: Home / Self Care | Payer: Self-pay | Source: Ambulatory Visit | Attending: Gastroenterology

## 2018-07-02 DIAGNOSIS — I10 Essential (primary) hypertension: Secondary | ICD-10-CM | POA: Diagnosis not present

## 2018-07-02 DIAGNOSIS — K219 Gastro-esophageal reflux disease without esophagitis: Secondary | ICD-10-CM | POA: Insufficient documentation

## 2018-07-02 DIAGNOSIS — Z538 Procedure and treatment not carried out for other reasons: Secondary | ICD-10-CM | POA: Diagnosis not present

## 2018-07-02 DIAGNOSIS — Z1211 Encounter for screening for malignant neoplasm of colon: Secondary | ICD-10-CM | POA: Insufficient documentation

## 2018-07-02 DIAGNOSIS — Z79890 Hormone replacement therapy (postmenopausal): Secondary | ICD-10-CM | POA: Insufficient documentation

## 2018-07-02 DIAGNOSIS — M810 Age-related osteoporosis without current pathological fracture: Secondary | ICD-10-CM | POA: Insufficient documentation

## 2018-07-02 DIAGNOSIS — F329 Major depressive disorder, single episode, unspecified: Secondary | ICD-10-CM | POA: Insufficient documentation

## 2018-07-02 DIAGNOSIS — E079 Disorder of thyroid, unspecified: Secondary | ICD-10-CM | POA: Insufficient documentation

## 2018-07-02 DIAGNOSIS — F419 Anxiety disorder, unspecified: Secondary | ICD-10-CM | POA: Diagnosis not present

## 2018-07-02 DIAGNOSIS — E785 Hyperlipidemia, unspecified: Secondary | ICD-10-CM | POA: Diagnosis not present

## 2018-07-02 DIAGNOSIS — Z79899 Other long term (current) drug therapy: Secondary | ICD-10-CM | POA: Insufficient documentation

## 2018-07-02 HISTORY — DX: Other chest pain: R07.89

## 2018-07-02 HISTORY — PX: COLONOSCOPY WITH PROPOFOL: SHX5780

## 2018-07-02 HISTORY — DX: Essential (primary) hypertension: I10

## 2018-07-02 LAB — CBC WITH DIFFERENTIAL/PLATELET
BASOS ABS: 0 10*3/uL (ref 0–0.1)
Basophils Relative: 1 %
EOS ABS: 0.1 10*3/uL (ref 0–0.7)
EOS PCT: 2 %
HCT: 40.5 % (ref 35.0–47.0)
HEMOGLOBIN: 14 g/dL (ref 12.0–16.0)
LYMPHS ABS: 1.1 10*3/uL (ref 1.0–3.6)
LYMPHS PCT: 22 %
MCH: 32.1 pg (ref 26.0–34.0)
MCHC: 34.6 g/dL (ref 32.0–36.0)
MCV: 92.9 fL (ref 80.0–100.0)
Monocytes Absolute: 0.5 10*3/uL (ref 0.2–0.9)
Monocytes Relative: 10 %
NEUTROS ABS: 3.1 10*3/uL (ref 1.4–6.5)
Neutrophils Relative %: 65 %
PLATELETS: 131 10*3/uL — AB (ref 150–440)
RBC: 4.36 MIL/uL (ref 3.80–5.20)
RDW: 14.6 % — ABNORMAL HIGH (ref 11.5–14.5)
WBC: 4.7 10*3/uL (ref 3.6–11.0)

## 2018-07-02 LAB — PROTIME-INR
INR: 0.94
PROTHROMBIN TIME: 12.5 s (ref 11.4–15.2)

## 2018-07-02 SURGERY — COLONOSCOPY WITH PROPOFOL
Anesthesia: General

## 2018-07-02 MED ORDER — PROPOFOL 10 MG/ML IV BOLUS
INTRAVENOUS | Status: DC | PRN
  Administered 2018-07-02: 70 mg via INTRAVENOUS

## 2018-07-02 MED ORDER — PROPOFOL 500 MG/50ML IV EMUL
INTRAVENOUS | Status: DC | PRN
  Administered 2018-07-02: 150 ug/kg/min via INTRAVENOUS

## 2018-07-02 MED ORDER — SODIUM CHLORIDE 0.9 % IV SOLN
INTRAVENOUS | Status: DC
  Administered 2018-07-02: 1000 mL via INTRAVENOUS

## 2018-07-02 NOTE — H&P (Signed)
Outpatient short stay form Pre-procedure 07/02/2018 11:33 AM Lollie Sails MD  Primary Physician: Dr. Fulton Reek  Reason for visit: Colonoscopy  History of present illness: Patient is a 67 year old female presenting today for colon cancer screening.  Her last colonoscopy was in 2006.  At that time she had diverticulosis but was otherwise normal with the exception of not being able to advance beyond about 40 cm.  The colon was described as "tacked down."  She had a barium enema study for completion.  She tolerated her prep well.  Takes no aspirin or blood thinning agent.    Current Facility-Administered Medications:  .  0.9 %  sodium chloride infusion, , Intravenous, Continuous, Lollie Sails, MD, Last Rate: 20 mL/hr at 07/02/18 1121, 1,000 mL at 07/02/18 1121  Medications Prior to Admission  Medication Sig Dispense Refill Last Dose  . ALPRAZolam (XANAX) 0.25 MG tablet Take 0.25 mg by mouth 2 (two) times daily as needed.    07/01/2018 at Unknown time  . atorvastatin (LIPITOR) 20 MG tablet Take by mouth daily at 6 PM.    07/02/2018 at 0800  . Cholecalciferol (VITAMIN D3) 1000 units CAPS Take by mouth every morning.    07/01/2018 at Unknown time  . esomeprazole (NEXIUM) 20 MG capsule Take 20 mg by mouth daily at 12 noon.   07/01/2018 at Unknown time  . levothyroxine (SYNTHROID, LEVOTHROID) 100 MCG tablet Take by mouth.    07/02/2018 at 0800  . omeprazole (PRILOSEC) 20 MG capsule Take 20 mg by mouth daily.   07/01/2018 at Unknown time  . Potassium 99 MG TABS Take by mouth every morning.    07/01/2018 at Unknown time  . Potassium Aminobenzoate (POTABA) 500 MG CAPS Take 500 mg by mouth daily.   07/01/2018 at Unknown time  . raloxifene (EVISTA) 60 MG tablet Take by mouth daily.    07/01/2018 at Unknown time  . SYRINGE-NEEDLE, DISP, 3 ML (LUER LOCK SAFETY SYRINGES) 22G X 1" 3 ML MISC Use 1 syringes with each dose of B 12 4 each 0 07/01/2018 at Unknown time  . vitamin B-12 (CYANOCOBALAMIN) 1000 MCG tablet  Take 2 tablets (2,000 mcg total) by mouth daily. 60 tablet 3 07/01/2018 at Unknown time     No Known Allergies   Past Medical History:  Diagnosis Date  . Anxiety   . Chest pain, non-cardiac   . Depression   . GERD (gastroesophageal reflux disease)   . Hyperlipidemia 2004  . Hyperlipidemia   . Hypertension   . Osteoporosis   . Thyroid disease     Review of systems:      Physical Exam    Heart and lungs: Without rub or gallop, lungs are bilaterally clear.    HEENT: Normocephalic atraumatic eyes are anicteric    Other:    Pertinant exam for procedure: Soft nontender nondistended bowel sounds positive normoactive.    Planned proceedures: Colonoscopy and indicated procedures. I have discussed the risks benefits and complications of procedures to include not limited to bleeding, infection, perforation and the risk of sedation and the patient wishes to proceed.    Lollie Sails, MD Gastroenterology 07/02/2018  11:33 AM

## 2018-07-02 NOTE — Anesthesia Postprocedure Evaluation (Signed)
Anesthesia Post Note  Patient: Audrey Finley  Procedure(s) Performed: COLONOSCOPY WITH PROPOFOL (N/A )  Patient location during evaluation: Endoscopy Anesthesia Type: General Level of consciousness: awake and alert Pain management: pain level controlled Vital Signs Assessment: post-procedure vital signs reviewed and stable Respiratory status: spontaneous breathing, nonlabored ventilation, respiratory function stable and patient connected to nasal cannula oxygen Cardiovascular status: blood pressure returned to baseline and stable Postop Assessment: no apparent nausea or vomiting Anesthetic complications: no     Last Vitals:  Vitals:   07/02/18 1150 07/02/18 1210  BP: (!) 103/58 131/75  Pulse: 79   Resp: 17   Temp: 36.7 C   SpO2: 100%     Last Pain:  Vitals:   07/02/18 1210  TempSrc:   PainSc: 0-No pain                 Akaya Proffit S

## 2018-07-02 NOTE — Transfer of Care (Signed)
Immediate Anesthesia Transfer of Care Note  Patient: Audrey Finley  Procedure(s) Performed: COLONOSCOPY WITH PROPOFOL (N/A )  Patient Location: PACU  Anesthesia Type:General  Level of Consciousness: awake  Airway & Oxygen Therapy: Patient Spontanous Breathing and Patient connected to nasal cannula oxygen  Post-op Assessment: Report given to RN and Post -op Vital signs reviewed and stable  Post vital signs: Reviewed and stable  Last Vitals:  Vitals Value Taken Time  BP    Temp    Pulse    Resp    SpO2      Last Pain:  Vitals:   07/02/18 1045  TempSrc: Tympanic  PainSc: 0-No pain         Complications: No apparent anesthesia complications

## 2018-07-02 NOTE — Anesthesia Post-op Follow-up Note (Signed)
Anesthesia QCDR form completed.        

## 2018-07-02 NOTE — Anesthesia Procedure Notes (Signed)
Performed by: Moni Rothrock, CRNA Pre-anesthesia Checklist: Patient identified, Emergency Drugs available, Suction available, Patient being monitored and Timeout performed Patient Re-evaluated:Patient Re-evaluated prior to induction Oxygen Delivery Method: Nasal cannula Induction Type: IV induction       

## 2018-07-02 NOTE — Anesthesia Preprocedure Evaluation (Signed)
Anesthesia Evaluation  Patient identified by MRN, date of birth, ID band Patient awake    Reviewed: Allergy & Precautions, NPO status , Patient's Chart, lab work & pertinent test results, reviewed documented beta blocker date and time   Airway Mallampati: II  TM Distance: >3 FB     Dental  (+) Chipped   Pulmonary           Cardiovascular hypertension, Pt. on medications      Neuro/Psych PSYCHIATRIC DISORDERS Anxiety Depression    GI/Hepatic GERD  ,  Endo/Other    Renal/GU      Musculoskeletal   Abdominal   Peds  Hematology   Anesthesia Other Findings   Reproductive/Obstetrics                             Anesthesia Physical Anesthesia Plan  ASA: II  Anesthesia Plan: General   Post-op Pain Management:    Induction: Intravenous  PONV Risk Score and Plan:   Airway Management Planned:   Additional Equipment:   Intra-op Plan:   Post-operative Plan:   Informed Consent: I have reviewed the patients History and Physical, chart, labs and discussed the procedure including the risks, benefits and alternatives for the proposed anesthesia with the patient or authorized representative who has indicated his/her understanding and acceptance.     Plan Discussed with: CRNA  Anesthesia Plan Comments:         Anesthesia Quick Evaluation

## 2018-07-02 NOTE — Op Note (Signed)
[facility] Patient Name: Audrey Finley Procedure Date: 07/02/2018 11:34 AM MRN: [id number] Account #: [id number] Date of Birth: [birth year] Admit Type: Outpatient Age: 67 Room: [facility] ENDO ROOM 3 Gender: Female Note Status: Finalized Procedure:            Colonoscopy Indications:          Screening for colorectal malignant neoplasm Providers:            Lollie Sails, MD Referring MD:         Leonie Douglas. Doy Hutching, MD (Referring MD) Medicines:            Monitored Anesthesia Care Complications:        No immediate complications. Procedure:            Pre-Anesthesia Assessment:                       - ASA Grade Assessment: II - A patient with mild                        systemic disease.                       After obtaining informed consent, the colonoscope was                        passed under direct vision. Throughout the procedure,                        the patient's blood pressure, pulse, and oxygen                        saturations were monitored continuously. The                        Colonoscope was introduced through the anus with the                        intention of advancing to the cecum. The scope was                        advanced to the sigmoid colon before the procedure was                        aborted. Medications were given. The colonoscopy was                        extremely difficult due to poor bowel prep with stool                        present. The quality of the bowel preparation was poor. Findings:      The digital rectal exam was normal.      A large amount of semi-liquid solid stool was found in the recto-sigmoid       colon, precluding visualization. Impression:           - Preparation of the colon was poor.                       - Stool in the recto-sigmoid colon.                       -  No specimens collected. Recommendation:       - Will need to reschedule and reprep. Procedure Code(s):    ---  Professional ---                       [phone], 75, Colonoscopy, flexible; diagnostic, including                        collection of specimen(s) by brushing or washing, when                        performed (separate procedure) Diagnosis Code(s):    --- Professional ---                       Z12.11, Encounter for screening for malignant neoplasm                        of colon CPT copyright 2017 [employer]. All rights reserved. The codes documented in this report are preliminary and upon coder review may  be revised to meet current compliance requirements. Lollie Sails, MD 07/02/2018 11:49:41 AM This report has been signed electronically. Number of Addenda: 0 Note Initiated On: 07/02/2018 11:34 AM Total Procedure Duration: 0 hours 2 minutes 3 seconds       [facility]

## 2018-07-05 ENCOUNTER — Encounter: Payer: Self-pay | Admitting: Gastroenterology

## 2018-07-09 DIAGNOSIS — M810 Age-related osteoporosis without current pathological fracture: Secondary | ICD-10-CM | POA: Diagnosis not present

## 2018-07-15 ENCOUNTER — Inpatient Hospital Stay: Payer: PPO | Attending: Oncology

## 2018-07-15 ENCOUNTER — Other Ambulatory Visit: Payer: Self-pay

## 2018-07-15 DIAGNOSIS — F329 Major depressive disorder, single episode, unspecified: Secondary | ICD-10-CM | POA: Diagnosis not present

## 2018-07-15 DIAGNOSIS — Z79899 Other long term (current) drug therapy: Secondary | ICD-10-CM | POA: Insufficient documentation

## 2018-07-15 DIAGNOSIS — E079 Disorder of thyroid, unspecified: Secondary | ICD-10-CM | POA: Diagnosis not present

## 2018-07-15 DIAGNOSIS — I10 Essential (primary) hypertension: Secondary | ICD-10-CM | POA: Diagnosis not present

## 2018-07-15 DIAGNOSIS — D696 Thrombocytopenia, unspecified: Secondary | ICD-10-CM | POA: Diagnosis not present

## 2018-07-15 DIAGNOSIS — F419 Anxiety disorder, unspecified: Secondary | ICD-10-CM | POA: Insufficient documentation

## 2018-07-15 DIAGNOSIS — E785 Hyperlipidemia, unspecified: Secondary | ICD-10-CM | POA: Insufficient documentation

## 2018-07-15 DIAGNOSIS — E538 Deficiency of other specified B group vitamins: Secondary | ICD-10-CM

## 2018-07-15 DIAGNOSIS — K219 Gastro-esophageal reflux disease without esophagitis: Secondary | ICD-10-CM | POA: Diagnosis not present

## 2018-07-15 LAB — CBC WITH DIFFERENTIAL/PLATELET
Basophils Absolute: 0 10*3/uL (ref 0–0.1)
Basophils Relative: 1 %
EOS ABS: 0.1 10*3/uL (ref 0–0.7)
EOS PCT: 2 %
HCT: 40.2 % (ref 35.0–47.0)
Hemoglobin: 13.5 g/dL (ref 12.0–16.0)
LYMPHS ABS: 1.4 10*3/uL (ref 1.0–3.6)
Lymphocytes Relative: 24 %
MCH: 31.3 pg (ref 26.0–34.0)
MCHC: 33.5 g/dL (ref 32.0–36.0)
MCV: 93.6 fL (ref 80.0–100.0)
MONOS PCT: 9 %
Monocytes Absolute: 0.5 10*3/uL (ref 0.2–0.9)
Neutro Abs: 3.7 10*3/uL (ref 1.4–6.5)
Neutrophils Relative %: 64 %
Platelets: 140 10*3/uL — ABNORMAL LOW (ref 150–440)
RBC: 4.3 MIL/uL (ref 3.80–5.20)
RDW: 14.3 % (ref 11.5–14.5)
WBC: 5.8 10*3/uL (ref 3.6–11.0)

## 2018-07-15 LAB — VITAMIN B12: Vitamin B-12: 3435 pg/mL — ABNORMAL HIGH (ref 180–914)

## 2018-07-16 ENCOUNTER — Inpatient Hospital Stay (HOSPITAL_BASED_OUTPATIENT_CLINIC_OR_DEPARTMENT_OTHER): Payer: PPO | Admitting: Oncology

## 2018-07-16 ENCOUNTER — Encounter: Payer: Self-pay | Admitting: Oncology

## 2018-07-16 VITALS — BP 114/70 | HR 67 | Temp 98.5°F | Resp 16 | Wt 176.3 lb

## 2018-07-16 DIAGNOSIS — Z79899 Other long term (current) drug therapy: Secondary | ICD-10-CM

## 2018-07-16 DIAGNOSIS — D696 Thrombocytopenia, unspecified: Secondary | ICD-10-CM

## 2018-07-16 DIAGNOSIS — E538 Deficiency of other specified B group vitamins: Secondary | ICD-10-CM

## 2018-07-16 NOTE — Progress Notes (Signed)
Patient here today for follow up, Patient states no new concerns

## 2018-07-16 NOTE — Progress Notes (Signed)
Hematology/Oncology follow up note [facility] [phone] [phone]   Patient Care Team: Idelle Crouch, MD as PCP - General (Internal Medicine)  REFERRING PROVIDER: Idelle Crouch, MD REASON FOR VISIT Follow up for treatment of thrombocytopenia.  HISTORY OF PRESENTING ILLNESS:  Audrey Finley is a  67 y.o.  female with PMH listed below who was referred to me for evaluation of thrombocytopenia. Patient recently had lab work done which revealed thrombocytopenia, platelet counts 146,000.  Reviewed patient's previous labs, his platelet count was 125,000 in march 2018, 129,0000 to 138,000 from 2016-2018 .  Patient denies fatigue, weight loss, easy bruising, hematochezia, hemoptysis  She denies fatigue, weight loss, easy bruising, hematochezia, hemoptysis.  Alcohol Use: socially 2 glassed of wine per week.  INTERVAL HISTORY MALAYSHIA ALL is a 67 y.o. female who has above history reviewed by me today presents for follow-up of vitamin B12 deficiency and management of thrombocytopenia.  #Patient reports feeling well.  She takes oral vitamin B12 supplements   #Thrombocytopenia,Denies hematochezia, hematuria, hematemesis, epistaxis, black tarry stool or easy bruising.  Patient told me that recently she was scheduled to have colonoscopy done however as the prep was not optimistic, colonoscopy was delayed. Overall she feels well.  Denies any fatigue, weight loss, fever or chills.  Review of Systems  Constitutional: Negative for chills, fever, malaise/fatigue and weight loss.  HENT: Negative for congestion, ear discharge, ear pain, nosebleeds, sinus pain and sore throat.   Eyes: Negative for blurred vision, double vision, photophobia, pain, discharge and redness.  Respiratory: Negative for cough, hemoptysis, sputum production, shortness of breath and wheezing.   Cardiovascular: Negative for chest pain, palpitations, orthopnea, claudication and  leg swelling.  Gastrointestinal: Negative for abdominal pain, blood in stool, constipation, diarrhea, heartburn, melena, nausea and vomiting.  Genitourinary: Negative for dysuria, flank pain, frequency, hematuria and urgency.  Musculoskeletal: Negative for back pain, myalgias and neck pain.  Skin: Negative for itching and rash.  Neurological: Negative for dizziness, tingling, tremors, focal weakness, weakness and headaches.  Endo/Heme/Allergies: Negative for environmental allergies. Does not bruise/bleed easily.  Psychiatric/Behavioral: Negative for depression, hallucinations and substance abuse. The patient is not nervous/anxious.     MEDICAL HISTORY:  Past Medical History:  Diagnosis Date  . Anxiety   . Chest pain, non-cardiac   . Depression   . GERD (gastroesophageal reflux disease)   . Hyperlipidemia 2004  . Hyperlipidemia   . Hypertension   . Osteoporosis   . Thyroid disease     SURGICAL HISTORY: Past Surgical History:  Procedure Laterality Date  . ABDOMINAL HYSTERECTOMY    . CESAREAN SECTION     X2  . COLONOSCOPY    . COLONOSCOPY WITH PROPOFOL N/A 07/02/2018   Procedure: COLONOSCOPY WITH PROPOFOL;  Surgeon: Lollie Sails, MD;  Location: [facility] ENDOSCOPY;  Service: Endoscopy;  Laterality: N/A;  . TONSILLECTOMY    . TUBAL LIGATION      SOCIAL HISTORY: Social History   Socioeconomic History  . Marital status: Married    Spouse name: Not on file  . Number of children: Not on file  . Years of education: Not on file  . Highest education level: Not on file  Occupational History  . Not on file  Social Needs  . Financial resource strain: Not on file  . Food insecurity:    Worry: Not on file    Inability: Not on file  . Transportation needs:    Medical: Not on file  Non-medical: Not on file  Tobacco Use  . Smoking status: Never Smoker  . Smokeless tobacco: Never Used  Substance and Sexual Activity  . Alcohol use: Yes    Comment: socially -2 glasses of wine  per wk  . Drug use: No  . Sexual activity: Yes    Birth control/protection: Spermicide  Lifestyle  . Physical activity:    Days per week: Not on file    Minutes per session: Not on file  . Stress: Not on file  Relationships  . Social connections:    Talks on phone: Not on file    Gets together: Not on file    Attends religious service: Not on file    Active member of club or organization: Not on file    Attends meetings of clubs or organizations: Not on file    Relationship status: Not on file  . Intimate partner violence:    Fear of current or ex partner: Not on file    Emotionally abused: Not on file    Physically abused: Not on file    Forced sexual activity: Not on file  Other Topics Concern  . Not on file  Social History Narrative  . Not on file    FAMILY HISTORY: Family History  Problem Relation Age of Onset  . Diabetes Mother   . Coronary artery disease Mother   . CVA Mother   . Coronary artery disease Father   . Kidney disease Father   . Hyperlipidemia Sister   . Breast cancer Neg Hx     ALLERGIES:  has No Known Allergies.  MEDICATIONS:  Current Outpatient Medications  Medication Sig Dispense Refill  . ALPRAZolam (XANAX) 0.25 MG tablet Take 0.25 mg by mouth 2 (two) times daily as needed.     Marland Kitchen atorvastatin (LIPITOR) 20 MG tablet Take by mouth daily at 6 PM.     . Cholecalciferol (VITAMIN D3) 1000 units CAPS Take by mouth every morning.     Marland Kitchen esomeprazole (NEXIUM) 20 MG capsule Take 20 mg by mouth daily at 12 noon.    Marland Kitchen levothyroxine (SYNTHROID, LEVOTHROID) 75 MCG tablet Take 75 mcg by mouth daily.    Marland Kitchen omeprazole (PRILOSEC) 20 MG capsule Take 20 mg by mouth daily.    . Potassium 99 MG TABS Take by mouth every morning.     . raloxifene (EVISTA) 60 MG tablet Take by mouth daily.     . vitamin B-12 (CYANOCOBALAMIN) 1000 MCG tablet Take 2 tablets (2,000 mcg total) by mouth daily. 60 tablet 3   No current facility-administered medications for this visit.       PHYSICAL EXAMINATION: ECOG PERFORMANCE STATUS: 0 - Asymptomatic Vitals:   07/16/18 1028  BP: 114/70  Pulse: 67  Resp: 16  Temp: 98.5 F (36.9 C)   Filed Weights   07/16/18 1028  Weight: 176 lb 5 oz (80 kg)    Physical Exam  Constitutional: She is oriented to person, place, and time and well-developed, well-nourished, and in no distress. No distress.  HENT:  Head: Normocephalic and atraumatic.  Nose: Nose normal.  Mouth/Throat: Oropharynx is clear and moist. No oropharyngeal exudate.  Eyes: Pupils are equal, round, and reactive to light. Conjunctivae and EOM are normal. Left eye exhibits no discharge. No scleral icterus.  Neck: Normal range of motion. Neck supple. No JVD present.  Cardiovascular: Normal rate, regular rhythm and normal heart sounds.  No murmur heard. Pulmonary/Chest: Effort normal and breath sounds normal. No respiratory distress. She  has no wheezes. She has no rales. She exhibits no tenderness.  Abdominal: Soft. Bowel sounds are normal. She exhibits no distension and no mass. There is no tenderness. There is no rebound and no guarding.  Musculoskeletal: Normal range of motion. She exhibits no edema, tenderness or deformity.  Lymphadenopathy:    She has no cervical adenopathy.  Neurological: She is alert and oriented to person, place, and time. No cranial nerve deficit. She exhibits normal muscle tone. Coordination normal.  Skin: Skin is warm and dry. No rash noted. She is not diaphoretic. No erythema.  Psychiatric: Affect and judgment normal.     LABORATORY DATA:  I have reviewed the data as listed Lab Results  Component Value Date   WBC 5.8 07/15/2018   HGB 13.5 07/15/2018   HCT 40.2 07/15/2018   MCV 93.6 07/15/2018   PLT 140 (L) 07/15/2018   Recent Labs    02/12/18 1525  NA 140  K 3.6  CL 105  CO2 27  GLUCOSE 85  BUN 25*  CREATININE 0.82  CALCIUM 9.4  GFRNONAA >60  GFRAA >60  PROT 7.3  ALBUMIN 4.1  AST 20  ALT 22  ALKPHOS 66   BILITOT 1.0     Negative flowcytometry.  US spleen upper limit.    parietal antibody and intrinsic factor antibody are negative  ASSESSMENT & PLAN:  1. Vitamin B12 deficiency   2. Thrombocytopenia ([place])   # Vitamin B12 deficiency is resolved.  Vitamin B12 level is high.  Advised patient to hold vitamin B12 supplements for about 4 weeks. She can restart oral vitamin B12 supplements 2-3 times a week.  She voices understanding.  # Thrombocytopenia: She has had negative work up including hepatitis, HIV, flowcytometry.  Platelet count has been stable.  Today's counts 1 40,000. Hold additional work-up for now.  Continue  Monitor  All questions were answered. The patient knows to call the clinic with any problems questions or concerns.  Return of visit: 6 months. Total face to face encounter time for this patient visit was 15 min. >50% of the time was  spent in counseling and coordination of care.   Earlie Server, MD, PhD Hematology Oncology [facility] Pager- [phone]

## 2018-07-23 DIAGNOSIS — E038 Other specified hypothyroidism: Secondary | ICD-10-CM | POA: Diagnosis not present

## 2018-07-23 DIAGNOSIS — E063 Autoimmune thyroiditis: Secondary | ICD-10-CM | POA: Diagnosis not present

## 2018-08-20 DIAGNOSIS — E079 Disorder of thyroid, unspecified: Secondary | ICD-10-CM | POA: Diagnosis not present

## 2018-08-20 DIAGNOSIS — I10 Essential (primary) hypertension: Secondary | ICD-10-CM | POA: Diagnosis not present

## 2018-08-20 DIAGNOSIS — D696 Thrombocytopenia, unspecified: Secondary | ICD-10-CM | POA: Diagnosis not present

## 2018-08-20 DIAGNOSIS — Z1211 Encounter for screening for malignant neoplasm of colon: Secondary | ICD-10-CM | POA: Diagnosis not present

## 2018-09-02 ENCOUNTER — Encounter: Payer: Self-pay | Admitting: *Deleted

## 2018-09-02 DIAGNOSIS — D696 Thrombocytopenia, unspecified: Secondary | ICD-10-CM | POA: Diagnosis not present

## 2018-09-03 ENCOUNTER — Ambulatory Visit: Payer: PPO | Admitting: Anesthesiology

## 2018-09-03 ENCOUNTER — Encounter
Admission: RE | Disposition: A | Discharge status: Home / Self Care | Payer: Self-pay | Source: Ambulatory Visit | Attending: Gastroenterology

## 2018-09-03 ENCOUNTER — Ambulatory Visit
Admission: RE | Admit: 2018-09-03 | Discharge: 2018-09-03 | Disposition: A | Discharge status: Home / Self Care | Payer: PPO | Source: Ambulatory Visit | Attending: Gastroenterology | Admitting: Gastroenterology

## 2018-09-03 ENCOUNTER — Encounter: Payer: Self-pay | Admitting: Anesthesiology

## 2018-09-03 DIAGNOSIS — M810 Age-related osteoporosis without current pathological fracture: Secondary | ICD-10-CM | POA: Diagnosis not present

## 2018-09-03 DIAGNOSIS — Z79899 Other long term (current) drug therapy: Secondary | ICD-10-CM | POA: Insufficient documentation

## 2018-09-03 DIAGNOSIS — Z1211 Encounter for screening for malignant neoplasm of colon: Secondary | ICD-10-CM | POA: Diagnosis not present

## 2018-09-03 DIAGNOSIS — K219 Gastro-esophageal reflux disease without esophagitis: Secondary | ICD-10-CM | POA: Diagnosis not present

## 2018-09-03 DIAGNOSIS — F419 Anxiety disorder, unspecified: Secondary | ICD-10-CM | POA: Diagnosis not present

## 2018-09-03 DIAGNOSIS — E785 Hyperlipidemia, unspecified: Secondary | ICD-10-CM | POA: Diagnosis not present

## 2018-09-03 DIAGNOSIS — K5790 Diverticulosis of intestine, part unspecified, without perforation or abscess without bleeding: Secondary | ICD-10-CM | POA: Diagnosis not present

## 2018-09-03 DIAGNOSIS — K5730 Diverticulosis of large intestine without perforation or abscess without bleeding: Secondary | ICD-10-CM | POA: Insufficient documentation

## 2018-09-03 DIAGNOSIS — Z79890 Hormone replacement therapy (postmenopausal): Secondary | ICD-10-CM | POA: Diagnosis not present

## 2018-09-03 DIAGNOSIS — Q438 Other specified congenital malformations of intestine: Secondary | ICD-10-CM | POA: Insufficient documentation

## 2018-09-03 DIAGNOSIS — I10 Essential (primary) hypertension: Secondary | ICD-10-CM | POA: Diagnosis not present

## 2018-09-03 HISTORY — PX: COLONOSCOPY WITH PROPOFOL: SHX5780

## 2018-09-03 SURGERY — COLONOSCOPY WITH PROPOFOL
Anesthesia: General

## 2018-09-03 MED ORDER — PROPOFOL 10 MG/ML IV BOLUS
INTRAVENOUS | Status: DC | PRN
  Administered 2018-09-03: 100 mg via INTRAVENOUS

## 2018-09-03 MED ORDER — LIDOCAINE 2% (20 MG/ML) 5 ML SYRINGE
INTRAMUSCULAR | Status: DC | PRN
  Administered 2018-09-03: 30 mg via INTRAVENOUS

## 2018-09-03 MED ORDER — PROPOFOL 500 MG/50ML IV EMUL
INTRAVENOUS | Status: DC | PRN
  Administered 2018-09-03: 180 ug/kg/min via INTRAVENOUS

## 2018-09-03 MED ORDER — SODIUM CHLORIDE 0.9 % IV SOLN
INTRAVENOUS | Status: DC
  Administered 2018-09-03: 1000 mL via INTRAVENOUS

## 2018-09-03 MED ORDER — PROPOFOL 500 MG/50ML IV EMUL
INTRAVENOUS | Status: AC
  Filled 2018-09-03: qty 50

## 2018-09-03 MED ORDER — FENTANYL CITRATE (PF) 100 MCG/2ML IJ SOLN
INTRAMUSCULAR | Status: AC
  Filled 2018-09-03: qty 2

## 2018-09-03 MED ORDER — LIDOCAINE HCL (PF) 1 % IJ SOLN
INTRAMUSCULAR | Status: AC
  Administered 2018-09-03: 0.3 mL
  Filled 2018-09-03: qty 2

## 2018-09-03 MED ORDER — FENTANYL CITRATE (PF) 100 MCG/2ML IJ SOLN
INTRAMUSCULAR | Status: DC | PRN
  Administered 2018-09-03 (×2): 50 ug via INTRAVENOUS

## 2018-09-03 NOTE — Anesthesia Preprocedure Evaluation (Signed)
Anesthesia Evaluation  Patient identified by MRN, date of birth, ID band Patient awake    Reviewed: Allergy & Precautions, NPO status , Patient's Chart, lab work & pertinent test results, reviewed documented beta blocker date and time   History of Anesthesia Complications Negative for: history of anesthetic complications  Airway Mallampati: I  TM Distance: >3 FB     Dental  (+) Chipped, Caps, Dental Advidsory Given   Pulmonary neg pulmonary ROS,           Cardiovascular Exercise Tolerance: Good hypertension, Pt. on medications (-) angina(-) CAD, (-) Past MI, (-) Cardiac Stents and (-) CABG (-) dysrhythmias (-) Valvular Problems/Murmurs     Neuro/Psych PSYCHIATRIC DISORDERS Anxiety Depression negative neurological ROS     GI/Hepatic Neg liver ROS, GERD  ,  Endo/Other  negative endocrine ROSneg diabetesHypothyroidism   Renal/GU negative Renal ROS     Musculoskeletal   Abdominal   Peds  Hematology   Anesthesia Other Findings Past Medical History: No date: Anxiety No date: Chest pain, non-cardiac No date: Depression No date: GERD (gastroesophageal reflux disease) 2004: Hyperlipidemia No date: Hyperlipidemia No date: Hypertension No date: Osteoporosis No date: Thyroid disease   Reproductive/Obstetrics negative OB ROS                             Anesthesia Physical  Anesthesia Plan  ASA: II  Anesthesia Plan: General   Post-op Pain Management:    Induction: Intravenous  PONV Risk Score and Plan: 3 and Propofol infusion and TIVA  Airway Management Planned: Natural Airway and Nasal Cannula  Additional Equipment:   Intra-op Plan:   Post-operative Plan:   Informed Consent: I have reviewed the patients History and Physical, chart, labs and discussed the procedure including the risks, benefits and alternatives for the proposed anesthesia with the patient or authorized  representative who has indicated his/her understanding and acceptance.     Plan Discussed with: CRNA  Anesthesia Plan Comments:         Anesthesia Quick Evaluation

## 2018-09-03 NOTE — Op Note (Signed)
[facility] Gastroenterology Patient Name: Audrey Finley Procedure Date: 09/03/2018 12:42 PM MRN: [id number] Account #: [id number] Date of Birth: [birth year] Admit Type: Outpatient Age: 67 Room: [facility] ENDO ROOM 3 Gender: Female Note Status: Finalized Procedure:            Colonoscopy Indications:          Screening for colorectal malignant neoplasm Providers:            Lollie Sails, MD Referring MD:         Leonie Douglas. Doy Hutching, MD (Referring MD) Medicines:            Monitored Anesthesia Care Complications:        No immediate complications. Procedure:            Pre-Anesthesia Assessment:                       - ASA Grade Assessment: II - A patient with mild                        systemic disease.                       After obtaining informed consent, the colonoscope was                        passed under direct vision. Throughout the procedure,                        the patient's blood pressure, pulse, and oxygen                        saturations were monitored continuously. The                        Colonoscope was introduced through the anus and                        advanced to the the cecum, identified by appendiceal                        orifice and ileocecal valve. The colonoscopy was                        unusually difficult due to a redundant colon and a                        tortuous colon. Successful completion of the procedure                        was aided by changing the patient to a supine position,                        changing the patient to a prone position and using                        manual pressure. The patient tolerated the procedure                        well. The quality of the bowel preparation was good. Findings:      Many small  and large-mouthed diverticula were found in the sigmoid colon       and descending colon.      The retroflexed view of the distal rectum and anal verge was normal and       showed no anal  or rectal abnormalities.      The digital rectal exam was normal. Impression:           - Diverticulosis in the sigmoid colon and in the                        descending colon.                       - The distal rectum and anal verge are normal on                        retroflexion view.                       - No specimens collected. Recommendation:       - Discharge patient to home.                       - Repeat colonoscopy in 10 years for screening purposes. Procedure Code(s):    --- Professional ---                       [phone], Colonoscopy, flexible; diagnostic, including                        collection of specimen(s) by brushing or washing, when                        performed (separate procedure) Diagnosis Code(s):    --- Professional ---                       Z12.11, Encounter for screening for malignant neoplasm                        of colon                       K57.30, Diverticulosis of large intestine without                        perforation or abscess without bleeding CPT copyright 2017 American Medical [employer]. All rights reserved. The codes documented in this report are preliminary and upon coder review may  be revised to meet current compliance requirements. Lollie Sails, MD 09/03/2018 1:35:20 PM This report has been signed electronically. Number of Addenda: 0 Note Initiated On: 09/03/2018 12:42 PM Scope Withdrawal Time: 0 hours 5 minutes 10 seconds  Total Procedure Duration: 0 hours 28 minutes 35 seconds       [facility]

## 2018-09-03 NOTE — Anesthesia Post-op Follow-up Note (Signed)
Anesthesia QCDR form completed.        

## 2018-09-03 NOTE — H&P (Addendum)
Outpatient short stay form Pre-procedure 09/03/2018 12:35 PM Lollie Sails MD  Primary Physician: Dr. Fulton Reek  Reason for visit: Colonoscopy  History of present illness: Patient is a 67 year old female presenting today for screening colonoscopy.  We attempted this for weeks ago however this was not possible due to a poor prep.  She stated that her prep went better this time.  She takes no aspirin or blood thinning agent.  Of note patient states that she had a colonoscopy attempt many years ago that could not be completed due to colonic tortuosity or perhaps scarring from previous abdominal surgeries.    Current Facility-Administered Medications:  .  0.9 %  sodium chloride infusion, , Intravenous, Continuous, Lollie Sails, MD, Last Rate: 20 mL/hr at 09/03/18 1234, 1,000 mL at 09/03/18 1234  Medications Prior to Admission  Medication Sig Dispense Refill Last Dose  . ALPRAZolam (XANAX) 0.25 MG tablet Take 0.25 mg by mouth 2 (two) times daily as needed.    Taking  . atorvastatin (LIPITOR) 20 MG tablet Take by mouth daily at 6 PM.    Taking  . Cholecalciferol (VITAMIN D3) 1000 units CAPS Take by mouth every morning.    Taking  . esomeprazole (NEXIUM) 20 MG capsule Take 20 mg by mouth daily at 12 noon.   Taking  . levothyroxine (SYNTHROID, LEVOTHROID) 75 MCG tablet Take 75 mcg by mouth daily.   09/03/2018 at 0600  . omeprazole (PRILOSEC) 20 MG capsule Take 20 mg by mouth daily.   Taking  . Potassium 99 MG TABS Take by mouth every morning.    Taking  . raloxifene (EVISTA) 60 MG tablet Take by mouth daily.    09/03/2018 at 0600  . vitamin B-12 (CYANOCOBALAMIN) 1000 MCG tablet Take 2 tablets (2,000 mcg total) by mouth daily. 60 tablet 3 Taking     No Known Allergies   Past Medical History:  Diagnosis Date  . Anxiety   . Chest pain, non-cardiac   . Depression   . GERD (gastroesophageal reflux disease)   . Hyperlipidemia 2004  . Hyperlipidemia   . Hypertension   .  Osteoporosis   . Thyroid disease     Review of systems:      Physical Exam    Heart and lungs: Regular rate and rhythm without rub or gallop, lungs are bilaterally clear.    HEENT: Normocephalic atraumatic eyes are anicteric    Other:    Pertinant exam for procedure: Soft nontender nondistended bowel sounds positive normoactive    Planned proceedures: Colonoscopy and indicated procedures. I have discussed the risks benefits and complications of procedures to include not limited to bleeding, infection, perforation and the risk of sedation and the patient wishes to proceed.    Lollie Sails, MD Gastroenterology 09/03/2018  12:35 PM

## 2018-09-03 NOTE — Transfer of Care (Signed)
Immediate Anesthesia Transfer of Care Note  Patient: Audrey Finley  Procedure(s) Performed: COLONOSCOPY WITH PROPOFOL (N/A )  Patient Location: PACU and Endoscopy Unit  Anesthesia Type:General  Level of Consciousness: sedated  Airway & Oxygen Therapy: Patient Spontanous Breathing and Patient connected to nasal cannula oxygen  Post-op Assessment: Report given to RN and Post -op Vital signs reviewed and stable  Post vital signs: Reviewed and stable  Last Vitals:  Vitals Value Taken Time  BP    Temp    Pulse 75 09/03/2018  1:38 PM  Resp 25 09/03/2018  1:38 PM  SpO2 96 % 09/03/2018  1:38 PM  Vitals shown include unvalidated device data.  Last Pain:  Vitals:   09/03/18 1218  TempSrc: Tympanic  PainSc: 0-No pain         Complications: No apparent anesthesia complications

## 2018-09-04 ENCOUNTER — Encounter: Payer: Self-pay | Admitting: Gastroenterology

## 2018-09-05 NOTE — Anesthesia Postprocedure Evaluation (Signed)
Anesthesia Post Note  Patient: Audrey Finley  Procedure(s) Performed: COLONOSCOPY WITH PROPOFOL (N/A )  Patient location during evaluation: Endoscopy Anesthesia Type: General Level of consciousness: awake and alert Pain management: pain level controlled Vital Signs Assessment: post-procedure vital signs reviewed and stable Respiratory status: spontaneous breathing, nonlabored ventilation, respiratory function stable and patient connected to nasal cannula oxygen Cardiovascular status: blood pressure returned to baseline and stable Postop Assessment: no apparent nausea or vomiting Anesthetic complications: no     Last Vitals:  Vitals:   09/03/18 1350 09/03/18 1400  BP: 111/72 109/65  Pulse: 71 63  Resp: (!) 21 (!) 21  Temp:    SpO2: 100% 100%    Last Pain:  Vitals:   09/04/18 1212  TempSrc:   PainSc: 0-No pain                 Martha Clan

## 2018-09-22 ENCOUNTER — Other Ambulatory Visit: Payer: Self-pay | Admitting: Internal Medicine

## 2018-09-22 DIAGNOSIS — Z1231 Encounter for screening mammogram for malignant neoplasm of breast: Secondary | ICD-10-CM

## 2018-10-01 DIAGNOSIS — I10 Essential (primary) hypertension: Secondary | ICD-10-CM | POA: Diagnosis not present

## 2018-10-01 DIAGNOSIS — E063 Autoimmune thyroiditis: Secondary | ICD-10-CM | POA: Diagnosis not present

## 2018-10-01 DIAGNOSIS — Z79899 Other long term (current) drug therapy: Secondary | ICD-10-CM | POA: Diagnosis not present

## 2018-10-01 DIAGNOSIS — E038 Other specified hypothyroidism: Secondary | ICD-10-CM | POA: Diagnosis not present

## 2018-10-01 DIAGNOSIS — E782 Mixed hyperlipidemia: Secondary | ICD-10-CM | POA: Diagnosis not present

## 2018-10-15 ENCOUNTER — Ambulatory Visit
Admission: RE | Admit: 2018-10-15 | Discharge: 2018-10-15 | Disposition: A | Discharge status: Home / Self Care | Payer: PPO | Source: Ambulatory Visit | Attending: Internal Medicine | Admitting: Internal Medicine

## 2018-10-15 DIAGNOSIS — Z1231 Encounter for screening mammogram for malignant neoplasm of breast: Secondary | ICD-10-CM | POA: Insufficient documentation

## 2018-11-01 DIAGNOSIS — Z79899 Other long term (current) drug therapy: Secondary | ICD-10-CM | POA: Diagnosis not present

## 2018-11-01 DIAGNOSIS — E063 Autoimmune thyroiditis: Secondary | ICD-10-CM | POA: Diagnosis not present

## 2018-11-01 DIAGNOSIS — E038 Other specified hypothyroidism: Secondary | ICD-10-CM | POA: Diagnosis not present

## 2018-11-11 DIAGNOSIS — Z79899 Other long term (current) drug therapy: Secondary | ICD-10-CM | POA: Diagnosis not present

## 2018-11-11 DIAGNOSIS — E782 Mixed hyperlipidemia: Secondary | ICD-10-CM | POA: Diagnosis not present

## 2018-11-11 DIAGNOSIS — E038 Other specified hypothyroidism: Secondary | ICD-10-CM | POA: Diagnosis not present

## 2018-11-11 DIAGNOSIS — E063 Autoimmune thyroiditis: Secondary | ICD-10-CM | POA: Diagnosis not present

## 2018-11-11 DIAGNOSIS — I10 Essential (primary) hypertension: Secondary | ICD-10-CM | POA: Diagnosis not present

## 2018-12-06 DIAGNOSIS — E063 Autoimmune thyroiditis: Secondary | ICD-10-CM | POA: Diagnosis not present

## 2018-12-06 DIAGNOSIS — E038 Other specified hypothyroidism: Secondary | ICD-10-CM | POA: Diagnosis not present

## 2019-01-20 ENCOUNTER — Other Ambulatory Visit: Payer: Self-pay

## 2019-01-20 ENCOUNTER — Inpatient Hospital Stay: Payer: PPO | Attending: Oncology

## 2019-01-20 DIAGNOSIS — E785 Hyperlipidemia, unspecified: Secondary | ICD-10-CM | POA: Insufficient documentation

## 2019-01-20 DIAGNOSIS — I10 Essential (primary) hypertension: Secondary | ICD-10-CM | POA: Insufficient documentation

## 2019-01-20 DIAGNOSIS — E079 Disorder of thyroid, unspecified: Secondary | ICD-10-CM | POA: Insufficient documentation

## 2019-01-20 DIAGNOSIS — Z79899 Other long term (current) drug therapy: Secondary | ICD-10-CM | POA: Insufficient documentation

## 2019-01-20 DIAGNOSIS — E538 Deficiency of other specified B group vitamins: Secondary | ICD-10-CM | POA: Insufficient documentation

## 2019-01-20 DIAGNOSIS — D696 Thrombocytopenia, unspecified: Secondary | ICD-10-CM | POA: Insufficient documentation

## 2019-01-20 LAB — CBC WITH DIFFERENTIAL/PLATELET
ABS IMMATURE GRANULOCYTES: 0.01 10*3/uL (ref 0.00–0.07)
BASOS PCT: 1 %
Basophils Absolute: 0 10*3/uL (ref 0.0–0.1)
Eosinophils Absolute: 0.1 10*3/uL (ref 0.0–0.5)
Eosinophils Relative: 2 %
HCT: 41.7 % (ref 36.0–46.0)
Hemoglobin: 13.6 g/dL (ref 12.0–15.0)
IMMATURE GRANULOCYTES: 0 %
LYMPHS ABS: 2 10*3/uL (ref 0.7–4.0)
Lymphocytes Relative: 37 %
MCH: 29.5 pg (ref 26.0–34.0)
MCHC: 32.6 g/dL (ref 30.0–36.0)
MCV: 90.5 fL (ref 80.0–100.0)
MONO ABS: 0.4 10*3/uL (ref 0.1–1.0)
MONOS PCT: 8 %
NEUTROS ABS: 2.8 10*3/uL (ref 1.7–7.7)
NRBC: 0 % (ref 0.0–0.2)
Neutrophils Relative %: 52 %
PLATELETS: 164 10*3/uL (ref 150–400)
RBC: 4.61 MIL/uL (ref 3.87–5.11)
RDW: 13.2 % (ref 11.5–15.5)
WBC: 5.3 10*3/uL (ref 4.0–10.5)

## 2019-01-20 LAB — VITAMIN B12: Vitamin B-12: 914 pg/mL (ref 180–914)

## 2019-01-21 ENCOUNTER — Other Ambulatory Visit: Payer: Self-pay

## 2019-01-21 ENCOUNTER — Inpatient Hospital Stay (HOSPITAL_BASED_OUTPATIENT_CLINIC_OR_DEPARTMENT_OTHER): Payer: PPO | Admitting: Oncology

## 2019-01-21 ENCOUNTER — Encounter: Payer: Self-pay | Admitting: Oncology

## 2019-01-21 VITALS — BP 125/79 | HR 71 | Temp 97.3°F | Resp 18 | Wt 180.0 lb

## 2019-01-21 DIAGNOSIS — E785 Hyperlipidemia, unspecified: Secondary | ICD-10-CM

## 2019-01-21 DIAGNOSIS — E538 Deficiency of other specified B group vitamins: Secondary | ICD-10-CM

## 2019-01-21 DIAGNOSIS — I10 Essential (primary) hypertension: Secondary | ICD-10-CM | POA: Diagnosis not present

## 2019-01-21 DIAGNOSIS — D696 Thrombocytopenia, unspecified: Secondary | ICD-10-CM

## 2019-01-21 DIAGNOSIS — Z79899 Other long term (current) drug therapy: Secondary | ICD-10-CM

## 2019-01-21 DIAGNOSIS — E079 Disorder of thyroid, unspecified: Secondary | ICD-10-CM

## 2019-01-21 NOTE — Progress Notes (Signed)
Hematology/Oncology follow up note [facility] Telephone:([phone]) [phone] [phone]   Patient Care Team: Idelle Crouch, MD as PCP - General (Internal Medicine)  REFERRING PROVIDER: Idelle Crouch, MD REASON FOR VISIT Follow up for treatment of thrombocytopenia.  HISTORY OF PRESENTING ILLNESS:  Audrey Finley is a  68 y.o.  female with PMH listed below who was referred to me for evaluation of thrombocytopenia. Patient recently had lab work done which revealed thrombocytopenia, platelet counts 146,000.  Reviewed patient's previous labs, his platelet count was 125,000 in march 2018, 129,0000 to 138,000 from 2016-2018 .  Patient denies fatigue, weight loss, easy bruising, hematochezia, hemoptysis  She denies fatigue, weight loss, easy bruising, hematochezia, hemoptysis.  Alcohol Use: socially 2 glassed of wine per week.  INTERVAL HISTORY Audrey Finley is a 68 y.o. female who has above history reviewed by me today presents for follow-up of vitamin B12 deficiency and management of thrombocytopenia. She reports doing well at baseline.  Takes oral vitamin B12 daily.  Denies hematochezia, hematuria, hematemesis, epistaxis, black tarry stool or easy bruising.    Review of Systems  Constitutional: Negative for chills, fever, malaise/fatigue and weight loss.  HENT: Negative for sore throat.   Eyes: Negative for redness.  Respiratory: Negative for cough, shortness of breath and wheezing.   Cardiovascular: Negative for chest pain, palpitations and leg swelling.  Gastrointestinal: Negative for abdominal pain, blood in stool, nausea and vomiting.  Genitourinary: Negative for dysuria.  Musculoskeletal: Negative for myalgias.  Skin: Negative for rash.  Neurological: Negative for dizziness, tingling and tremors.  Endo/Heme/Allergies: Does not bruise/bleed easily.  Psychiatric/Behavioral: Negative for hallucinations.    MEDICAL HISTORY:  Past Medical  History:  Diagnosis Date  . Anxiety   . Chest pain, non-cardiac   . Depression   . GERD (gastroesophageal reflux disease)   . Hyperlipidemia 2004  . Hyperlipidemia   . Hypertension   . Osteoporosis   . Thyroid disease     SURGICAL HISTORY: Past Surgical History:  Procedure Laterality Date  . ABDOMINAL HYSTERECTOMY    . CESAREAN SECTION     X2  . COLONOSCOPY    . COLONOSCOPY WITH PROPOFOL N/A 07/02/2018   Procedure: COLONOSCOPY WITH PROPOFOL;  Surgeon: Lollie Sails, MD;  Location: [facility] ENDOSCOPY;  Service: Endoscopy;  Laterality: N/A;  . COLONOSCOPY WITH PROPOFOL N/A 09/03/2018   Procedure: COLONOSCOPY WITH PROPOFOL;  Surgeon: Lollie Sails, MD;  Location: [facility] ENDOSCOPY;  Service: Endoscopy;  Laterality: N/A;  . OOPHORECTOMY    . TONSILLECTOMY    . TUBAL LIGATION      SOCIAL HISTORY: Social History   Socioeconomic History  . Marital status: Married    Spouse name: Not on file  . Number of children: Not on file  . Years of education: Not on file  . Highest education level: Not on file  Occupational History  . Not on file  Social Needs  . Financial resource strain: Not on file  . Food insecurity:    Worry: Not on file    Inability: Not on file  . Transportation needs:    Medical: Not on file    Non-medical: Not on file  Tobacco Use  . Smoking status: Never Smoker  . Smokeless tobacco: Never Used  Substance and Sexual Activity  . Alcohol use: Yes    Comment: socially -2 glasses of wine per wk  . Drug use: No  . Sexual activity: Yes    Birth control/protection: Spermicide  Lifestyle  .  Physical activity:    Days per week: Not on file    Minutes per session: Not on file  . Stress: Not on file  Relationships  . Social connections:    Talks on phone: Not on file    Gets together: Not on file    Attends religious service: Not on file    Active member of club or organization: Not on file    Attends meetings of clubs or organizations: Not on file     Relationship status: Not on file  . Intimate partner violence:    Fear of current or ex partner: Not on file    Emotionally abused: Not on file    Physically abused: Not on file    Forced sexual activity: Not on file  Other Topics Concern  . Not on file  Social History Narrative  . Not on file    FAMILY HISTORY: Family History  Problem Relation Age of Onset  . Diabetes Mother   . Coronary artery disease Mother   . CVA Mother   . Coronary artery disease Father   . Kidney disease Father   . Hyperlipidemia Sister   . Breast cancer Neg Hx     ALLERGIES:  has No Known Allergies.  MEDICATIONS:  Current Outpatient Medications  Medication Sig Dispense Refill  . ALPRAZolam (XANAX) 0.25 MG tablet Take 0.25 mg by mouth 2 (two) times daily as needed.     Marland Kitchen atorvastatin (LIPITOR) 20 MG tablet Take 20 mg by mouth daily.    . bisoprolol-hydrochlorothiazide (ZIAC) 2.5-6.25 MG tablet Take 1 tablet by mouth daily.    . Cholecalciferol (VITAMIN D3) 1000 units CAPS Take by mouth every morning.     Marland Kitchen esomeprazole (NEXIUM) 20 MG capsule Take 20 mg by mouth daily at 12 noon.    Marland Kitchen levothyroxine (SYNTHROID, LEVOTHROID) 88 MCG tablet Take 88 mcg by mouth daily before breakfast.    . raloxifene (EVISTA) 60 MG tablet Take 60 mg by mouth daily.    . vitamin B-12 (CYANOCOBALAMIN) 1000 MCG tablet Take 2 tablets (2,000 mcg total) by mouth daily. (Patient taking differently: Take 2,000 mcg by mouth every 30 (thirty) days. ) 60 tablet 3  . atorvastatin (LIPITOR) 20 MG tablet Take by mouth daily at 6 PM.      No current facility-administered medications for this visit.      PHYSICAL EXAMINATION: ECOG PERFORMANCE STATUS: 0 - Asymptomatic Vitals:   01/21/19 1413  BP: 125/79  Pulse: 71  Resp: 18  Temp: (!) 97.3 F (36.3 C)   Filed Weights   01/21/19 1413  Weight: 180 lb (81.6 kg)    Physical Exam  Constitutional: She is oriented to person, place, and time and well-developed, well-nourished, and  in no distress. No distress.  HENT:  Head: Normocephalic and atraumatic.  Nose: Nose normal.  Mouth/Throat: Oropharynx is clear and moist. No oropharyngeal exudate.  Eyes: Pupils are equal, round, and reactive to light. Conjunctivae and EOM are normal. Left eye exhibits no discharge. No scleral icterus.  Neck: Normal range of motion. Neck supple. No JVD present.  Cardiovascular: Normal rate, regular rhythm and normal heart sounds.  No murmur heard. Pulmonary/Chest: Effort normal and breath sounds normal. No respiratory distress. She has no wheezes. She has no rales. She exhibits no tenderness.  Abdominal: Soft. Bowel sounds are normal. She exhibits no distension and no mass. There is no abdominal tenderness. There is no rebound and no guarding.  Musculoskeletal: Normal range of motion.  General: No tenderness, deformity or edema.  Lymphadenopathy:    She has no cervical adenopathy.  Neurological: She is alert and oriented to person, place, and time. No cranial nerve deficit. She exhibits normal muscle tone. Coordination normal.  Skin: Skin is warm and dry. No rash noted. She is not diaphoretic. No erythema.  Psychiatric: Affect and judgment normal.     LABORATORY DATA:  I have reviewed the data as listed Lab Results  Component Value Date   WBC 5.3 01/20/2019   HGB 13.6 01/20/2019   HCT 41.7 01/20/2019   MCV 90.5 01/20/2019   PLT 164 01/20/2019   Recent Labs    02/12/18 1525  NA 140  K 3.6  CL 105  CO2 27  GLUCOSE 85  BUN 25*  CREATININE 0.82  CALCIUM 9.4  GFRNONAA >60  GFRAA >60  PROT 7.3  ALBUMIN 4.1  AST 20  ALT 22  ALKPHOS 66  BILITOT 1.0     Negative flowcytometry.  US spleen upper limit.    parietal antibody and intrinsic factor antibody are negative  ASSESSMENT & PLAN:  1. Vitamin B12 deficiency   2. Thrombocytopenia ([place])   labs are reviewed and discussed with patient.  Thrombocytopenia has completely resolved after B12 deficiency is  corrected.  Recommend patient to continue follow up with PCP and have cbc and B12 levels checked once a year.  She will be discharged from our clinic.  We spent sufficient time to discuss many aspect of care, questions were answered to patient's satisfaction. Total face to face encounter time for this patient visit was 15 min. >50% of the time was  spent in counseling and coordination of care.    Earlie Server, MD, PhD Hematology Oncology [facility] Pager- [phone]

## 2019-01-21 NOTE — Progress Notes (Signed)
Patient here for follow up. Patient has been having problems with her weight. According to patient she has been having problems with her thyroid and medication has been adjusted multiple times.

## 2019-02-01 DIAGNOSIS — E063 Autoimmune thyroiditis: Secondary | ICD-10-CM | POA: Diagnosis not present

## 2019-02-01 DIAGNOSIS — E038 Other specified hypothyroidism: Secondary | ICD-10-CM | POA: Diagnosis not present

## 2019-02-01 DIAGNOSIS — Z79899 Other long term (current) drug therapy: Secondary | ICD-10-CM | POA: Diagnosis not present

## 2019-02-01 DIAGNOSIS — I10 Essential (primary) hypertension: Secondary | ICD-10-CM | POA: Diagnosis not present

## 2019-02-01 DIAGNOSIS — E782 Mixed hyperlipidemia: Secondary | ICD-10-CM | POA: Diagnosis not present

## 2019-02-11 DIAGNOSIS — I10 Essential (primary) hypertension: Secondary | ICD-10-CM | POA: Diagnosis not present

## 2019-02-11 DIAGNOSIS — E063 Autoimmune thyroiditis: Secondary | ICD-10-CM | POA: Diagnosis not present

## 2019-02-11 DIAGNOSIS — E038 Other specified hypothyroidism: Secondary | ICD-10-CM | POA: Diagnosis not present

## 2019-02-11 DIAGNOSIS — Z0000 Encounter for general adult medical examination without abnormal findings: Secondary | ICD-10-CM | POA: Diagnosis not present

## 2019-02-11 DIAGNOSIS — E782 Mixed hyperlipidemia: Secondary | ICD-10-CM | POA: Diagnosis not present

## 2019-04-08 DIAGNOSIS — E038 Other specified hypothyroidism: Secondary | ICD-10-CM | POA: Diagnosis not present

## 2019-04-08 DIAGNOSIS — E063 Autoimmune thyroiditis: Secondary | ICD-10-CM | POA: Diagnosis not present

## 2019-04-08 DIAGNOSIS — I10 Essential (primary) hypertension: Secondary | ICD-10-CM | POA: Diagnosis not present

## 2019-04-15 DIAGNOSIS — Z79899 Other long term (current) drug therapy: Secondary | ICD-10-CM | POA: Diagnosis not present

## 2019-04-15 DIAGNOSIS — E063 Autoimmune thyroiditis: Secondary | ICD-10-CM | POA: Diagnosis not present

## 2019-04-15 DIAGNOSIS — I10 Essential (primary) hypertension: Secondary | ICD-10-CM | POA: Diagnosis not present

## 2019-04-15 DIAGNOSIS — E038 Other specified hypothyroidism: Secondary | ICD-10-CM | POA: Diagnosis not present

## 2019-04-15 DIAGNOSIS — E782 Mixed hyperlipidemia: Secondary | ICD-10-CM | POA: Diagnosis not present

## 2019-07-18 DIAGNOSIS — H25813 Combined forms of age-related cataract, bilateral: Secondary | ICD-10-CM | POA: Diagnosis not present

## 2019-07-22 DIAGNOSIS — Z79899 Other long term (current) drug therapy: Secondary | ICD-10-CM | POA: Diagnosis not present

## 2019-07-22 DIAGNOSIS — E063 Autoimmune thyroiditis: Secondary | ICD-10-CM | POA: Diagnosis not present

## 2019-07-22 DIAGNOSIS — E038 Other specified hypothyroidism: Secondary | ICD-10-CM | POA: Diagnosis not present

## 2019-07-22 DIAGNOSIS — E782 Mixed hyperlipidemia: Secondary | ICD-10-CM | POA: Diagnosis not present

## 2019-08-01 DIAGNOSIS — Z79899 Other long term (current) drug therapy: Secondary | ICD-10-CM | POA: Diagnosis not present

## 2019-08-01 DIAGNOSIS — Z1239 Encounter for other screening for malignant neoplasm of breast: Secondary | ICD-10-CM | POA: Diagnosis not present

## 2019-08-01 DIAGNOSIS — F411 Generalized anxiety disorder: Secondary | ICD-10-CM | POA: Diagnosis not present

## 2019-08-01 DIAGNOSIS — E782 Mixed hyperlipidemia: Secondary | ICD-10-CM | POA: Diagnosis not present

## 2019-08-01 DIAGNOSIS — M810 Age-related osteoporosis without current pathological fracture: Secondary | ICD-10-CM | POA: Diagnosis not present

## 2019-08-01 DIAGNOSIS — I10 Essential (primary) hypertension: Secondary | ICD-10-CM | POA: Diagnosis not present

## 2019-08-01 DIAGNOSIS — E063 Autoimmune thyroiditis: Secondary | ICD-10-CM | POA: Diagnosis not present

## 2019-08-01 DIAGNOSIS — E038 Other specified hypothyroidism: Secondary | ICD-10-CM | POA: Diagnosis not present

## 2019-09-01 ENCOUNTER — Other Ambulatory Visit: Payer: Self-pay | Admitting: Internal Medicine

## 2019-09-01 DIAGNOSIS — Z1231 Encounter for screening mammogram for malignant neoplasm of breast: Secondary | ICD-10-CM

## 2019-10-17 ENCOUNTER — Ambulatory Visit
Admission: RE | Admit: 2019-10-17 | Discharge: 2019-10-17 | Disposition: A | Discharge status: Home / Self Care | Payer: PPO | Source: Ambulatory Visit | Attending: Internal Medicine | Admitting: Internal Medicine

## 2019-10-17 DIAGNOSIS — Z1231 Encounter for screening mammogram for malignant neoplasm of breast: Secondary | ICD-10-CM | POA: Diagnosis not present

## 2019-11-17 DIAGNOSIS — L900 Lichen sclerosus et atrophicus: Secondary | ICD-10-CM | POA: Diagnosis not present

## 2019-11-17 DIAGNOSIS — N9089 Other specified noninflammatory disorders of vulva and perineum: Secondary | ICD-10-CM | POA: Diagnosis not present

## 2019-11-17 DIAGNOSIS — N904 Leukoplakia of vulva: Secondary | ICD-10-CM | POA: Diagnosis not present

## 2019-11-17 DIAGNOSIS — N761 Subacute and chronic vaginitis: Secondary | ICD-10-CM | POA: Diagnosis not present

## 2020-01-26 DIAGNOSIS — I10 Essential (primary) hypertension: Secondary | ICD-10-CM | POA: Diagnosis not present

## 2020-01-26 DIAGNOSIS — M810 Age-related osteoporosis without current pathological fracture: Secondary | ICD-10-CM | POA: Diagnosis not present

## 2020-01-26 DIAGNOSIS — E063 Autoimmune thyroiditis: Secondary | ICD-10-CM | POA: Diagnosis not present

## 2020-01-26 DIAGNOSIS — E538 Deficiency of other specified B group vitamins: Secondary | ICD-10-CM | POA: Diagnosis not present

## 2020-01-26 DIAGNOSIS — Z79899 Other long term (current) drug therapy: Secondary | ICD-10-CM | POA: Diagnosis not present

## 2020-01-26 DIAGNOSIS — E038 Other specified hypothyroidism: Secondary | ICD-10-CM | POA: Diagnosis not present

## 2020-01-26 DIAGNOSIS — E782 Mixed hyperlipidemia: Secondary | ICD-10-CM | POA: Diagnosis not present

## 2020-02-02 DIAGNOSIS — M810 Age-related osteoporosis without current pathological fracture: Secondary | ICD-10-CM | POA: Diagnosis not present

## 2020-02-02 DIAGNOSIS — F329 Major depressive disorder, single episode, unspecified: Secondary | ICD-10-CM | POA: Diagnosis not present

## 2020-02-02 DIAGNOSIS — F419 Anxiety disorder, unspecified: Secondary | ICD-10-CM | POA: Diagnosis not present

## 2020-02-02 DIAGNOSIS — E782 Mixed hyperlipidemia: Secondary | ICD-10-CM | POA: Diagnosis not present

## 2020-02-02 DIAGNOSIS — E038 Other specified hypothyroidism: Secondary | ICD-10-CM | POA: Diagnosis not present

## 2020-02-02 DIAGNOSIS — Z0000 Encounter for general adult medical examination without abnormal findings: Secondary | ICD-10-CM | POA: Diagnosis not present

## 2020-02-02 DIAGNOSIS — I10 Essential (primary) hypertension: Secondary | ICD-10-CM | POA: Diagnosis not present

## 2020-02-02 DIAGNOSIS — E063 Autoimmune thyroiditis: Secondary | ICD-10-CM | POA: Diagnosis not present

## 2020-02-02 DIAGNOSIS — E559 Vitamin D deficiency, unspecified: Secondary | ICD-10-CM | POA: Diagnosis not present

## 2020-02-02 DIAGNOSIS — Z79899 Other long term (current) drug therapy: Secondary | ICD-10-CM | POA: Diagnosis not present

## 2020-05-31 DIAGNOSIS — M189 Osteoarthritis of first carpometacarpal joint, unspecified: Secondary | ICD-10-CM | POA: Diagnosis not present

## 2020-06-18 DIAGNOSIS — H0014 Chalazion left upper eyelid: Secondary | ICD-10-CM | POA: Diagnosis not present

## 2020-07-03 DIAGNOSIS — L089 Local infection of the skin and subcutaneous tissue, unspecified: Secondary | ICD-10-CM | POA: Diagnosis not present

## 2020-07-03 DIAGNOSIS — L723 Sebaceous cyst: Secondary | ICD-10-CM | POA: Diagnosis not present

## 2020-07-10 DIAGNOSIS — L089 Local infection of the skin and subcutaneous tissue, unspecified: Secondary | ICD-10-CM | POA: Diagnosis not present

## 2020-07-10 DIAGNOSIS — L720 Epidermal cyst: Secondary | ICD-10-CM | POA: Diagnosis not present

## 2020-07-24 DIAGNOSIS — L089 Local infection of the skin and subcutaneous tissue, unspecified: Secondary | ICD-10-CM | POA: Diagnosis not present

## 2020-07-24 DIAGNOSIS — L720 Epidermal cyst: Secondary | ICD-10-CM | POA: Diagnosis not present

## 2020-07-31 DIAGNOSIS — H25813 Combined forms of age-related cataract, bilateral: Secondary | ICD-10-CM | POA: Diagnosis not present

## 2020-08-02 DIAGNOSIS — E038 Other specified hypothyroidism: Secondary | ICD-10-CM | POA: Diagnosis not present

## 2020-08-02 DIAGNOSIS — E063 Autoimmune thyroiditis: Secondary | ICD-10-CM | POA: Diagnosis not present

## 2020-08-02 DIAGNOSIS — E559 Vitamin D deficiency, unspecified: Secondary | ICD-10-CM | POA: Diagnosis not present

## 2020-08-02 DIAGNOSIS — E782 Mixed hyperlipidemia: Secondary | ICD-10-CM | POA: Diagnosis not present

## 2020-08-02 DIAGNOSIS — I10 Essential (primary) hypertension: Secondary | ICD-10-CM | POA: Diagnosis not present

## 2020-08-02 DIAGNOSIS — Z79899 Other long term (current) drug therapy: Secondary | ICD-10-CM | POA: Diagnosis not present

## 2020-08-09 ENCOUNTER — Other Ambulatory Visit: Payer: Self-pay | Admitting: Internal Medicine

## 2020-08-09 DIAGNOSIS — E782 Mixed hyperlipidemia: Secondary | ICD-10-CM | POA: Diagnosis not present

## 2020-08-09 DIAGNOSIS — I10 Essential (primary) hypertension: Secondary | ICD-10-CM | POA: Diagnosis not present

## 2020-08-09 DIAGNOSIS — M47816 Spondylosis without myelopathy or radiculopathy, lumbar region: Secondary | ICD-10-CM | POA: Diagnosis not present

## 2020-08-09 DIAGNOSIS — E038 Other specified hypothyroidism: Secondary | ICD-10-CM | POA: Diagnosis not present

## 2020-08-09 DIAGNOSIS — M5489 Other dorsalgia: Secondary | ICD-10-CM | POA: Diagnosis not present

## 2020-08-09 DIAGNOSIS — Z1231 Encounter for screening mammogram for malignant neoplasm of breast: Secondary | ICD-10-CM

## 2020-08-09 DIAGNOSIS — M5137 Other intervertebral disc degeneration, lumbosacral region: Secondary | ICD-10-CM | POA: Diagnosis not present

## 2020-08-09 DIAGNOSIS — Z79899 Other long term (current) drug therapy: Secondary | ICD-10-CM | POA: Diagnosis not present

## 2020-08-09 DIAGNOSIS — E063 Autoimmune thyroiditis: Secondary | ICD-10-CM | POA: Diagnosis not present

## 2020-08-09 DIAGNOSIS — M810 Age-related osteoporosis without current pathological fracture: Secondary | ICD-10-CM | POA: Diagnosis not present

## 2020-08-13 DIAGNOSIS — M810 Age-related osteoporosis without current pathological fracture: Secondary | ICD-10-CM | POA: Diagnosis not present

## 2020-09-18 DIAGNOSIS — M810 Age-related osteoporosis without current pathological fracture: Secondary | ICD-10-CM | POA: Diagnosis not present

## 2020-09-18 DIAGNOSIS — E213 Hyperparathyroidism, unspecified: Secondary | ICD-10-CM | POA: Diagnosis not present

## 2020-09-18 DIAGNOSIS — E673 Hypervitaminosis D: Secondary | ICD-10-CM | POA: Diagnosis not present

## 2020-10-12 DIAGNOSIS — M810 Age-related osteoporosis without current pathological fracture: Secondary | ICD-10-CM | POA: Diagnosis not present

## 2020-10-17 DIAGNOSIS — M189 Osteoarthritis of first carpometacarpal joint, unspecified: Secondary | ICD-10-CM | POA: Diagnosis not present

## 2020-10-17 DIAGNOSIS — S66011A Strain of long flexor muscle, fascia and tendon of right thumb at wrist and hand level, initial encounter: Secondary | ICD-10-CM | POA: Diagnosis not present

## 2020-10-17 DIAGNOSIS — M1811 Unilateral primary osteoarthritis of first carpometacarpal joint, right hand: Secondary | ICD-10-CM | POA: Diagnosis not present

## 2020-10-18 ENCOUNTER — Ambulatory Visit
Admission: RE | Admit: 2020-10-18 | Discharge: 2020-10-18 | Disposition: A | Discharge status: Home / Self Care | Payer: PPO | Source: Ambulatory Visit | Attending: Internal Medicine | Admitting: Internal Medicine

## 2020-10-18 ENCOUNTER — Other Ambulatory Visit: Payer: Self-pay

## 2020-10-18 DIAGNOSIS — Z1231 Encounter for screening mammogram for malignant neoplasm of breast: Secondary | ICD-10-CM

## 2020-10-19 DIAGNOSIS — M19049 Primary osteoarthritis, unspecified hand: Secondary | ICD-10-CM | POA: Diagnosis not present

## 2020-10-19 DIAGNOSIS — M19042 Primary osteoarthritis, left hand: Secondary | ICD-10-CM | POA: Diagnosis not present

## 2020-12-20 DIAGNOSIS — E673 Hypervitaminosis D: Secondary | ICD-10-CM | POA: Diagnosis not present

## 2020-12-20 DIAGNOSIS — E213 Hyperparathyroidism, unspecified: Secondary | ICD-10-CM | POA: Diagnosis not present

## 2020-12-27 DIAGNOSIS — E213 Hyperparathyroidism, unspecified: Secondary | ICD-10-CM | POA: Diagnosis not present

## 2020-12-27 DIAGNOSIS — M810 Age-related osteoporosis without current pathological fracture: Secondary | ICD-10-CM | POA: Diagnosis not present

## 2020-12-27 DIAGNOSIS — E673 Hypervitaminosis D: Secondary | ICD-10-CM | POA: Diagnosis not present

## 2020-12-31 DIAGNOSIS — H01131 Eczematous dermatitis of right upper eyelid: Secondary | ICD-10-CM | POA: Diagnosis not present

## 2020-12-31 DIAGNOSIS — L905 Scar conditions and fibrosis of skin: Secondary | ICD-10-CM | POA: Diagnosis not present

## 2020-12-31 DIAGNOSIS — H01134 Eczematous dermatitis of left upper eyelid: Secondary | ICD-10-CM | POA: Diagnosis not present

## 2021-01-29 DIAGNOSIS — I10 Essential (primary) hypertension: Secondary | ICD-10-CM | POA: Diagnosis not present

## 2021-01-29 DIAGNOSIS — E673 Hypervitaminosis D: Secondary | ICD-10-CM | POA: Diagnosis not present

## 2021-01-29 DIAGNOSIS — E782 Mixed hyperlipidemia: Secondary | ICD-10-CM | POA: Diagnosis not present

## 2021-01-29 DIAGNOSIS — E063 Autoimmune thyroiditis: Secondary | ICD-10-CM | POA: Diagnosis not present

## 2021-01-29 DIAGNOSIS — Z79899 Other long term (current) drug therapy: Secondary | ICD-10-CM | POA: Diagnosis not present

## 2021-01-29 DIAGNOSIS — E213 Hyperparathyroidism, unspecified: Secondary | ICD-10-CM | POA: Diagnosis not present

## 2021-01-29 DIAGNOSIS — E038 Other specified hypothyroidism: Secondary | ICD-10-CM | POA: Diagnosis not present

## 2021-02-05 DIAGNOSIS — E063 Autoimmune thyroiditis: Secondary | ICD-10-CM | POA: Diagnosis not present

## 2021-02-05 DIAGNOSIS — F419 Anxiety disorder, unspecified: Secondary | ICD-10-CM | POA: Diagnosis not present

## 2021-02-05 DIAGNOSIS — Z79899 Other long term (current) drug therapy: Secondary | ICD-10-CM | POA: Diagnosis not present

## 2021-02-05 DIAGNOSIS — F32A Depression, unspecified: Secondary | ICD-10-CM | POA: Diagnosis not present

## 2021-02-05 DIAGNOSIS — E038 Other specified hypothyroidism: Secondary | ICD-10-CM | POA: Diagnosis not present

## 2021-02-05 DIAGNOSIS — Z0000 Encounter for general adult medical examination without abnormal findings: Secondary | ICD-10-CM | POA: Diagnosis not present

## 2021-02-05 DIAGNOSIS — I10 Essential (primary) hypertension: Secondary | ICD-10-CM | POA: Diagnosis not present

## 2021-02-05 DIAGNOSIS — E782 Mixed hyperlipidemia: Secondary | ICD-10-CM | POA: Diagnosis not present

## 2021-05-09 DIAGNOSIS — Z79899 Other long term (current) drug therapy: Secondary | ICD-10-CM | POA: Diagnosis not present

## 2021-05-09 DIAGNOSIS — E038 Other specified hypothyroidism: Secondary | ICD-10-CM | POA: Diagnosis not present

## 2021-05-09 DIAGNOSIS — E063 Autoimmune thyroiditis: Secondary | ICD-10-CM | POA: Diagnosis not present

## 2021-05-16 DIAGNOSIS — I10 Essential (primary) hypertension: Secondary | ICD-10-CM | POA: Diagnosis not present

## 2021-05-16 DIAGNOSIS — Z79899 Other long term (current) drug therapy: Secondary | ICD-10-CM | POA: Diagnosis not present

## 2021-05-16 DIAGNOSIS — E039 Hypothyroidism, unspecified: Secondary | ICD-10-CM | POA: Diagnosis not present

## 2021-05-16 DIAGNOSIS — E782 Mixed hyperlipidemia: Secondary | ICD-10-CM | POA: Diagnosis not present

## 2021-05-24 DIAGNOSIS — M9904 Segmental and somatic dysfunction of sacral region: Secondary | ICD-10-CM | POA: Diagnosis not present

## 2021-05-24 DIAGNOSIS — M5033 Other cervical disc degeneration, cervicothoracic region: Secondary | ICD-10-CM | POA: Diagnosis not present

## 2021-05-24 DIAGNOSIS — M9903 Segmental and somatic dysfunction of lumbar region: Secondary | ICD-10-CM | POA: Diagnosis not present

## 2021-05-24 DIAGNOSIS — M5137 Other intervertebral disc degeneration, lumbosacral region: Secondary | ICD-10-CM | POA: Diagnosis not present

## 2021-05-24 DIAGNOSIS — M9901 Segmental and somatic dysfunction of cervical region: Secondary | ICD-10-CM | POA: Diagnosis not present

## 2021-05-24 DIAGNOSIS — M47817 Spondylosis without myelopathy or radiculopathy, lumbosacral region: Secondary | ICD-10-CM | POA: Diagnosis not present

## 2021-05-27 DIAGNOSIS — M9901 Segmental and somatic dysfunction of cervical region: Secondary | ICD-10-CM | POA: Diagnosis not present

## 2021-05-27 DIAGNOSIS — M9903 Segmental and somatic dysfunction of lumbar region: Secondary | ICD-10-CM | POA: Diagnosis not present

## 2021-05-27 DIAGNOSIS — M5033 Other cervical disc degeneration, cervicothoracic region: Secondary | ICD-10-CM | POA: Diagnosis not present

## 2021-05-27 DIAGNOSIS — M9904 Segmental and somatic dysfunction of sacral region: Secondary | ICD-10-CM | POA: Diagnosis not present

## 2021-05-27 DIAGNOSIS — M5137 Other intervertebral disc degeneration, lumbosacral region: Secondary | ICD-10-CM | POA: Diagnosis not present

## 2021-05-27 DIAGNOSIS — M47817 Spondylosis without myelopathy or radiculopathy, lumbosacral region: Secondary | ICD-10-CM | POA: Diagnosis not present

## 2021-05-29 DIAGNOSIS — M9904 Segmental and somatic dysfunction of sacral region: Secondary | ICD-10-CM | POA: Diagnosis not present

## 2021-05-29 DIAGNOSIS — M5033 Other cervical disc degeneration, cervicothoracic region: Secondary | ICD-10-CM | POA: Diagnosis not present

## 2021-05-29 DIAGNOSIS — M5137 Other intervertebral disc degeneration, lumbosacral region: Secondary | ICD-10-CM | POA: Diagnosis not present

## 2021-05-29 DIAGNOSIS — M9901 Segmental and somatic dysfunction of cervical region: Secondary | ICD-10-CM | POA: Diagnosis not present

## 2021-05-29 DIAGNOSIS — M9903 Segmental and somatic dysfunction of lumbar region: Secondary | ICD-10-CM | POA: Diagnosis not present

## 2021-05-29 DIAGNOSIS — M47817 Spondylosis without myelopathy or radiculopathy, lumbosacral region: Secondary | ICD-10-CM | POA: Diagnosis not present

## 2021-05-30 DIAGNOSIS — M5137 Other intervertebral disc degeneration, lumbosacral region: Secondary | ICD-10-CM | POA: Diagnosis not present

## 2021-05-30 DIAGNOSIS — M9901 Segmental and somatic dysfunction of cervical region: Secondary | ICD-10-CM | POA: Diagnosis not present

## 2021-05-30 DIAGNOSIS — M5033 Other cervical disc degeneration, cervicothoracic region: Secondary | ICD-10-CM | POA: Diagnosis not present

## 2021-05-30 DIAGNOSIS — M9904 Segmental and somatic dysfunction of sacral region: Secondary | ICD-10-CM | POA: Diagnosis not present

## 2021-05-30 DIAGNOSIS — M9903 Segmental and somatic dysfunction of lumbar region: Secondary | ICD-10-CM | POA: Diagnosis not present

## 2021-05-30 DIAGNOSIS — M47817 Spondylosis without myelopathy or radiculopathy, lumbosacral region: Secondary | ICD-10-CM | POA: Diagnosis not present

## 2021-06-04 DIAGNOSIS — M9901 Segmental and somatic dysfunction of cervical region: Secondary | ICD-10-CM | POA: Diagnosis not present

## 2021-06-04 DIAGNOSIS — M5033 Other cervical disc degeneration, cervicothoracic region: Secondary | ICD-10-CM | POA: Diagnosis not present

## 2021-06-04 DIAGNOSIS — M9903 Segmental and somatic dysfunction of lumbar region: Secondary | ICD-10-CM | POA: Diagnosis not present

## 2021-06-04 DIAGNOSIS — M47817 Spondylosis without myelopathy or radiculopathy, lumbosacral region: Secondary | ICD-10-CM | POA: Diagnosis not present

## 2021-06-04 DIAGNOSIS — M5137 Other intervertebral disc degeneration, lumbosacral region: Secondary | ICD-10-CM | POA: Diagnosis not present

## 2021-06-04 DIAGNOSIS — M9904 Segmental and somatic dysfunction of sacral region: Secondary | ICD-10-CM | POA: Diagnosis not present

## 2021-06-06 DIAGNOSIS — M5137 Other intervertebral disc degeneration, lumbosacral region: Secondary | ICD-10-CM | POA: Diagnosis not present

## 2021-06-06 DIAGNOSIS — M9901 Segmental and somatic dysfunction of cervical region: Secondary | ICD-10-CM | POA: Diagnosis not present

## 2021-06-06 DIAGNOSIS — M47817 Spondylosis without myelopathy or radiculopathy, lumbosacral region: Secondary | ICD-10-CM | POA: Diagnosis not present

## 2021-06-06 DIAGNOSIS — M9903 Segmental and somatic dysfunction of lumbar region: Secondary | ICD-10-CM | POA: Diagnosis not present

## 2021-06-06 DIAGNOSIS — M5033 Other cervical disc degeneration, cervicothoracic region: Secondary | ICD-10-CM | POA: Diagnosis not present

## 2021-06-06 DIAGNOSIS — M9904 Segmental and somatic dysfunction of sacral region: Secondary | ICD-10-CM | POA: Diagnosis not present

## 2021-06-10 DIAGNOSIS — M5137 Other intervertebral disc degeneration, lumbosacral region: Secondary | ICD-10-CM | POA: Diagnosis not present

## 2021-06-10 DIAGNOSIS — M9901 Segmental and somatic dysfunction of cervical region: Secondary | ICD-10-CM | POA: Diagnosis not present

## 2021-06-10 DIAGNOSIS — M9903 Segmental and somatic dysfunction of lumbar region: Secondary | ICD-10-CM | POA: Diagnosis not present

## 2021-06-10 DIAGNOSIS — M9904 Segmental and somatic dysfunction of sacral region: Secondary | ICD-10-CM | POA: Diagnosis not present

## 2021-06-10 DIAGNOSIS — M47817 Spondylosis without myelopathy or radiculopathy, lumbosacral region: Secondary | ICD-10-CM | POA: Diagnosis not present

## 2021-06-10 DIAGNOSIS — M5033 Other cervical disc degeneration, cervicothoracic region: Secondary | ICD-10-CM | POA: Diagnosis not present

## 2021-06-13 DIAGNOSIS — M5137 Other intervertebral disc degeneration, lumbosacral region: Secondary | ICD-10-CM | POA: Diagnosis not present

## 2021-06-13 DIAGNOSIS — M5033 Other cervical disc degeneration, cervicothoracic region: Secondary | ICD-10-CM | POA: Diagnosis not present

## 2021-06-13 DIAGNOSIS — M9901 Segmental and somatic dysfunction of cervical region: Secondary | ICD-10-CM | POA: Diagnosis not present

## 2021-06-13 DIAGNOSIS — M47817 Spondylosis without myelopathy or radiculopathy, lumbosacral region: Secondary | ICD-10-CM | POA: Diagnosis not present

## 2021-06-13 DIAGNOSIS — M9904 Segmental and somatic dysfunction of sacral region: Secondary | ICD-10-CM | POA: Diagnosis not present

## 2021-06-13 DIAGNOSIS — M9903 Segmental and somatic dysfunction of lumbar region: Secondary | ICD-10-CM | POA: Diagnosis not present

## 2021-06-17 DIAGNOSIS — M9903 Segmental and somatic dysfunction of lumbar region: Secondary | ICD-10-CM | POA: Diagnosis not present

## 2021-06-17 DIAGNOSIS — M5033 Other cervical disc degeneration, cervicothoracic region: Secondary | ICD-10-CM | POA: Diagnosis not present

## 2021-06-17 DIAGNOSIS — M47817 Spondylosis without myelopathy or radiculopathy, lumbosacral region: Secondary | ICD-10-CM | POA: Diagnosis not present

## 2021-06-17 DIAGNOSIS — M5137 Other intervertebral disc degeneration, lumbosacral region: Secondary | ICD-10-CM | POA: Diagnosis not present

## 2021-06-17 DIAGNOSIS — M9901 Segmental and somatic dysfunction of cervical region: Secondary | ICD-10-CM | POA: Diagnosis not present

## 2021-06-17 DIAGNOSIS — M9904 Segmental and somatic dysfunction of sacral region: Secondary | ICD-10-CM | POA: Diagnosis not present

## 2021-06-19 DIAGNOSIS — M9904 Segmental and somatic dysfunction of sacral region: Secondary | ICD-10-CM | POA: Diagnosis not present

## 2021-06-19 DIAGNOSIS — M5137 Other intervertebral disc degeneration, lumbosacral region: Secondary | ICD-10-CM | POA: Diagnosis not present

## 2021-06-19 DIAGNOSIS — M9901 Segmental and somatic dysfunction of cervical region: Secondary | ICD-10-CM | POA: Diagnosis not present

## 2021-06-19 DIAGNOSIS — M47817 Spondylosis without myelopathy or radiculopathy, lumbosacral region: Secondary | ICD-10-CM | POA: Diagnosis not present

## 2021-06-19 DIAGNOSIS — M5033 Other cervical disc degeneration, cervicothoracic region: Secondary | ICD-10-CM | POA: Diagnosis not present

## 2021-06-19 DIAGNOSIS — M9903 Segmental and somatic dysfunction of lumbar region: Secondary | ICD-10-CM | POA: Diagnosis not present

## 2021-06-25 DIAGNOSIS — M5033 Other cervical disc degeneration, cervicothoracic region: Secondary | ICD-10-CM | POA: Diagnosis not present

## 2021-06-25 DIAGNOSIS — M47817 Spondylosis without myelopathy or radiculopathy, lumbosacral region: Secondary | ICD-10-CM | POA: Diagnosis not present

## 2021-06-25 DIAGNOSIS — M9904 Segmental and somatic dysfunction of sacral region: Secondary | ICD-10-CM | POA: Diagnosis not present

## 2021-06-25 DIAGNOSIS — M9901 Segmental and somatic dysfunction of cervical region: Secondary | ICD-10-CM | POA: Diagnosis not present

## 2021-06-25 DIAGNOSIS — M5137 Other intervertebral disc degeneration, lumbosacral region: Secondary | ICD-10-CM | POA: Diagnosis not present

## 2021-06-25 DIAGNOSIS — M9903 Segmental and somatic dysfunction of lumbar region: Secondary | ICD-10-CM | POA: Diagnosis not present

## 2021-07-02 DIAGNOSIS — M47817 Spondylosis without myelopathy or radiculopathy, lumbosacral region: Secondary | ICD-10-CM | POA: Diagnosis not present

## 2021-07-02 DIAGNOSIS — M9901 Segmental and somatic dysfunction of cervical region: Secondary | ICD-10-CM | POA: Diagnosis not present

## 2021-07-02 DIAGNOSIS — M9904 Segmental and somatic dysfunction of sacral region: Secondary | ICD-10-CM | POA: Diagnosis not present

## 2021-07-02 DIAGNOSIS — M9903 Segmental and somatic dysfunction of lumbar region: Secondary | ICD-10-CM | POA: Diagnosis not present

## 2021-07-02 DIAGNOSIS — M5137 Other intervertebral disc degeneration, lumbosacral region: Secondary | ICD-10-CM | POA: Diagnosis not present

## 2021-07-02 DIAGNOSIS — M5033 Other cervical disc degeneration, cervicothoracic region: Secondary | ICD-10-CM | POA: Diagnosis not present

## 2021-07-10 DIAGNOSIS — M5137 Other intervertebral disc degeneration, lumbosacral region: Secondary | ICD-10-CM | POA: Diagnosis not present

## 2021-07-10 DIAGNOSIS — M9904 Segmental and somatic dysfunction of sacral region: Secondary | ICD-10-CM | POA: Diagnosis not present

## 2021-07-10 DIAGNOSIS — M9903 Segmental and somatic dysfunction of lumbar region: Secondary | ICD-10-CM | POA: Diagnosis not present

## 2021-07-10 DIAGNOSIS — M9901 Segmental and somatic dysfunction of cervical region: Secondary | ICD-10-CM | POA: Diagnosis not present

## 2021-07-10 DIAGNOSIS — M5033 Other cervical disc degeneration, cervicothoracic region: Secondary | ICD-10-CM | POA: Diagnosis not present

## 2021-07-10 DIAGNOSIS — M47817 Spondylosis without myelopathy or radiculopathy, lumbosacral region: Secondary | ICD-10-CM | POA: Diagnosis not present

## 2021-07-22 DIAGNOSIS — M5033 Other cervical disc degeneration, cervicothoracic region: Secondary | ICD-10-CM | POA: Diagnosis not present

## 2021-07-22 DIAGNOSIS — M9904 Segmental and somatic dysfunction of sacral region: Secondary | ICD-10-CM | POA: Diagnosis not present

## 2021-07-22 DIAGNOSIS — M47817 Spondylosis without myelopathy or radiculopathy, lumbosacral region: Secondary | ICD-10-CM | POA: Diagnosis not present

## 2021-07-22 DIAGNOSIS — M9903 Segmental and somatic dysfunction of lumbar region: Secondary | ICD-10-CM | POA: Diagnosis not present

## 2021-07-22 DIAGNOSIS — M9901 Segmental and somatic dysfunction of cervical region: Secondary | ICD-10-CM | POA: Diagnosis not present

## 2021-07-22 DIAGNOSIS — M5137 Other intervertebral disc degeneration, lumbosacral region: Secondary | ICD-10-CM | POA: Diagnosis not present

## 2021-08-15 DIAGNOSIS — I10 Essential (primary) hypertension: Secondary | ICD-10-CM | POA: Diagnosis not present

## 2021-08-15 DIAGNOSIS — Z79899 Other long term (current) drug therapy: Secondary | ICD-10-CM | POA: Diagnosis not present

## 2021-08-15 DIAGNOSIS — E782 Mixed hyperlipidemia: Secondary | ICD-10-CM | POA: Diagnosis not present

## 2021-08-15 DIAGNOSIS — E039 Hypothyroidism, unspecified: Secondary | ICD-10-CM | POA: Diagnosis not present

## 2021-08-22 DIAGNOSIS — F32A Depression, unspecified: Secondary | ICD-10-CM | POA: Diagnosis not present

## 2021-08-22 DIAGNOSIS — Z1231 Encounter for screening mammogram for malignant neoplasm of breast: Secondary | ICD-10-CM | POA: Diagnosis not present

## 2021-08-22 DIAGNOSIS — I10 Essential (primary) hypertension: Secondary | ICD-10-CM | POA: Diagnosis not present

## 2021-08-22 DIAGNOSIS — Z23 Encounter for immunization: Secondary | ICD-10-CM | POA: Diagnosis not present

## 2021-08-22 DIAGNOSIS — M810 Age-related osteoporosis without current pathological fracture: Secondary | ICD-10-CM | POA: Diagnosis not present

## 2021-08-22 DIAGNOSIS — E079 Disorder of thyroid, unspecified: Secondary | ICD-10-CM | POA: Diagnosis not present

## 2021-08-22 DIAGNOSIS — E782 Mixed hyperlipidemia: Secondary | ICD-10-CM | POA: Diagnosis not present

## 2021-08-22 DIAGNOSIS — F419 Anxiety disorder, unspecified: Secondary | ICD-10-CM | POA: Diagnosis not present

## 2021-08-29 ENCOUNTER — Other Ambulatory Visit: Payer: Self-pay | Admitting: Internal Medicine

## 2021-08-29 DIAGNOSIS — Z1231 Encounter for screening mammogram for malignant neoplasm of breast: Secondary | ICD-10-CM

## 2021-09-10 DIAGNOSIS — M47817 Spondylosis without myelopathy or radiculopathy, lumbosacral region: Secondary | ICD-10-CM | POA: Diagnosis not present

## 2021-09-10 DIAGNOSIS — M9903 Segmental and somatic dysfunction of lumbar region: Secondary | ICD-10-CM | POA: Diagnosis not present

## 2021-09-10 DIAGNOSIS — M9904 Segmental and somatic dysfunction of sacral region: Secondary | ICD-10-CM | POA: Diagnosis not present

## 2021-09-10 DIAGNOSIS — M5033 Other cervical disc degeneration, cervicothoracic region: Secondary | ICD-10-CM | POA: Diagnosis not present

## 2021-09-10 DIAGNOSIS — M9901 Segmental and somatic dysfunction of cervical region: Secondary | ICD-10-CM | POA: Diagnosis not present

## 2021-09-10 DIAGNOSIS — M5137 Other intervertebral disc degeneration, lumbosacral region: Secondary | ICD-10-CM | POA: Diagnosis not present

## 2021-09-19 DIAGNOSIS — E213 Hyperparathyroidism, unspecified: Secondary | ICD-10-CM | POA: Diagnosis not present

## 2021-09-19 DIAGNOSIS — E673 Hypervitaminosis D: Secondary | ICD-10-CM | POA: Diagnosis not present

## 2021-09-19 DIAGNOSIS — M810 Age-related osteoporosis without current pathological fracture: Secondary | ICD-10-CM | POA: Diagnosis not present

## 2021-10-18 DIAGNOSIS — M810 Age-related osteoporosis without current pathological fracture: Secondary | ICD-10-CM | POA: Diagnosis not present

## 2021-10-21 ENCOUNTER — Ambulatory Visit
Admission: RE | Admit: 2021-10-21 | Discharge: 2021-10-21 | Disposition: A | Discharge status: Home / Self Care | Payer: PPO | Source: Ambulatory Visit | Attending: Internal Medicine | Admitting: Internal Medicine

## 2021-10-21 ENCOUNTER — Other Ambulatory Visit: Payer: Self-pay

## 2021-10-21 DIAGNOSIS — Z1231 Encounter for screening mammogram for malignant neoplasm of breast: Secondary | ICD-10-CM | POA: Insufficient documentation

## 2021-10-22 ENCOUNTER — Other Ambulatory Visit: Payer: Self-pay | Admitting: Family Medicine

## 2021-10-28 ENCOUNTER — Other Ambulatory Visit: Payer: Self-pay | Admitting: Internal Medicine

## 2021-10-28 DIAGNOSIS — N6310 Unspecified lump in the right breast, unspecified quadrant: Secondary | ICD-10-CM

## 2021-10-28 DIAGNOSIS — R928 Other abnormal and inconclusive findings on diagnostic imaging of breast: Secondary | ICD-10-CM

## 2021-10-31 ENCOUNTER — Other Ambulatory Visit: Payer: PPO

## 2021-10-31 ENCOUNTER — Ambulatory Visit: Admission: RE | Admit: 2021-10-31 | Payer: PPO | Source: Ambulatory Visit

## 2021-11-08 ENCOUNTER — Other Ambulatory Visit: Payer: Self-pay

## 2021-11-08 ENCOUNTER — Ambulatory Visit
Admission: RE | Admit: 2021-11-08 | Discharge: 2021-11-08 | Disposition: A | Discharge status: Home / Self Care | Payer: PPO | Source: Ambulatory Visit | Attending: Internal Medicine | Admitting: Internal Medicine

## 2021-11-08 DIAGNOSIS — N6310 Unspecified lump in the right breast, unspecified quadrant: Secondary | ICD-10-CM | POA: Diagnosis not present

## 2021-11-08 DIAGNOSIS — R922 Inconclusive mammogram: Secondary | ICD-10-CM | POA: Diagnosis not present

## 2021-11-08 DIAGNOSIS — R928 Other abnormal and inconclusive findings on diagnostic imaging of breast: Secondary | ICD-10-CM | POA: Diagnosis not present

## 2021-11-24 IMAGING — MG MM DIGITAL SCREENING BILAT W/ TOMO AND CAD
8 series · 8 of 24 positions shown · non-contrast
Comparison: Previous exam(s).

CLINICAL DATA: Screening.

EXAM:
DIGITAL SCREENING BILATERAL MAMMOGRAM WITH TOMOSYNTHESIS AND CAD
TECHNIQUE: Bilateral screening digital craniocaudal and mediolateral oblique
mammograms were obtained. Bilateral screening digital breast
tomosynthesis was performed. The images were evaluated with
computer-aided detection.

[L MLO synth-2D]
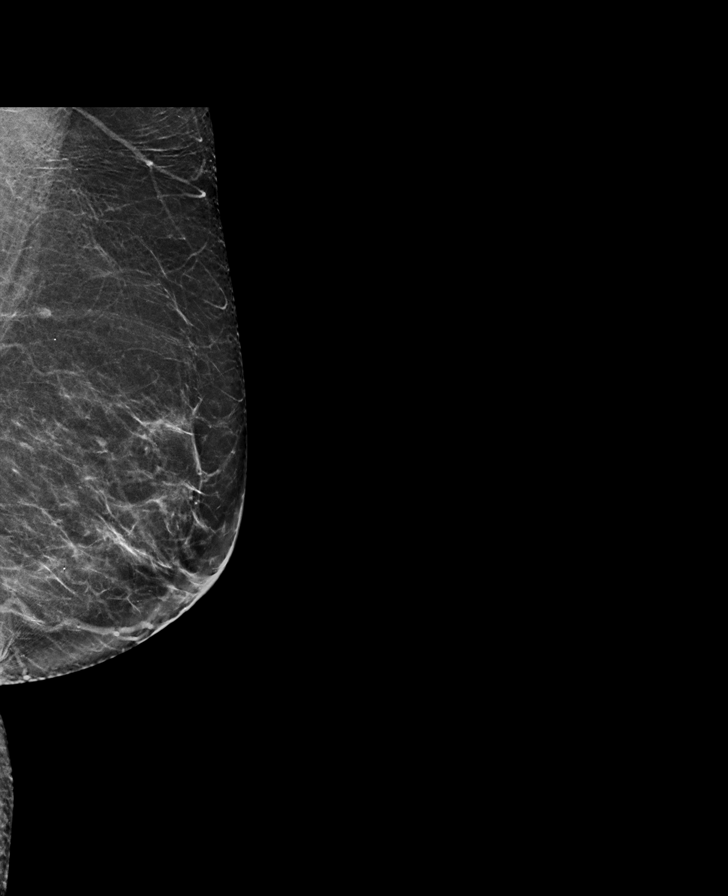

[R CC synth-2D]
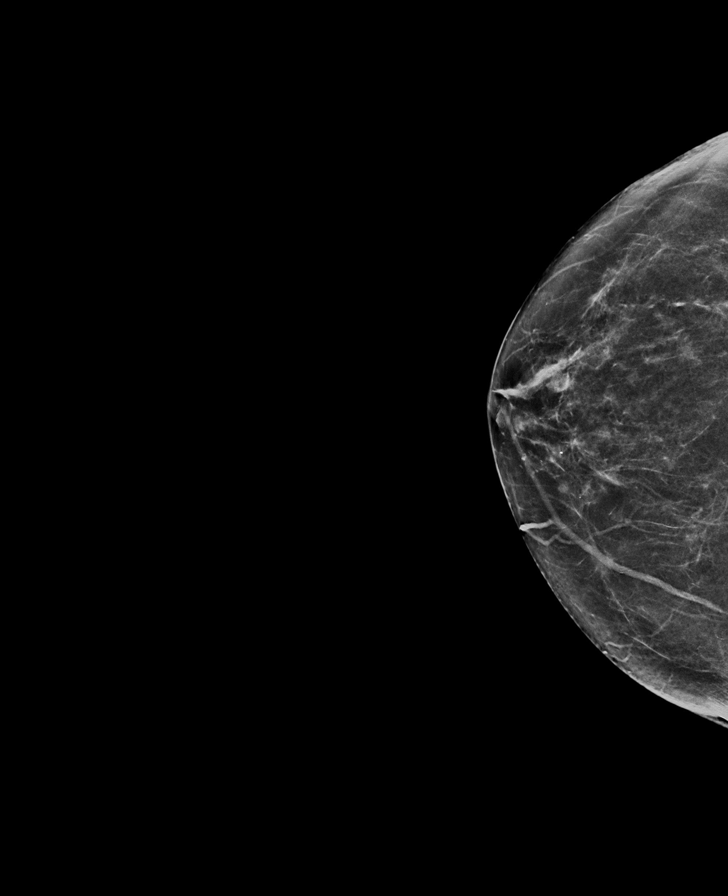

[L CC synth-2D]
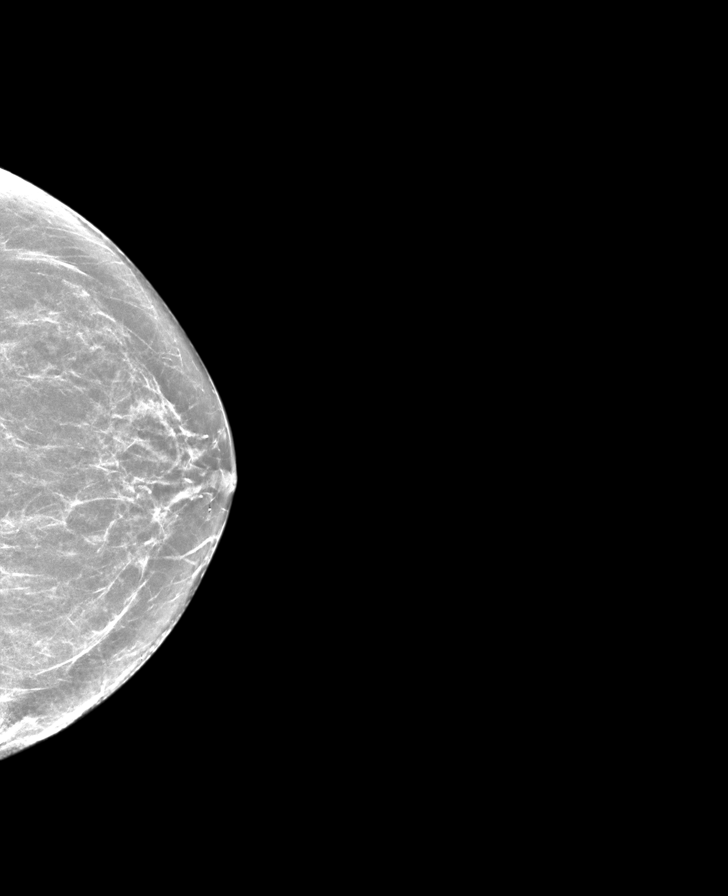

[R MLO synth-2D]
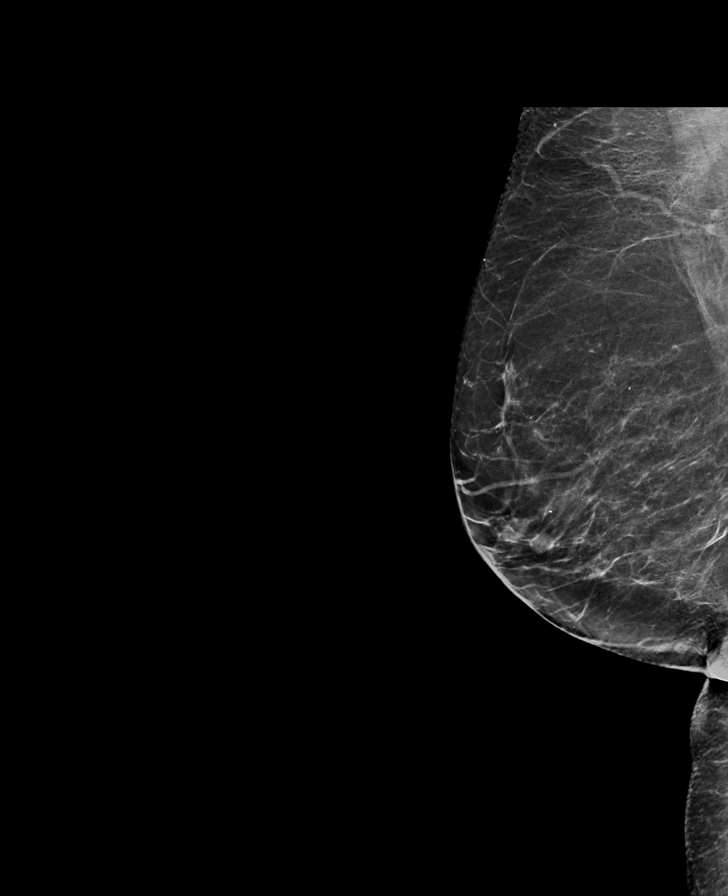

[L MLO tomo · tomo slice 35/68.0]
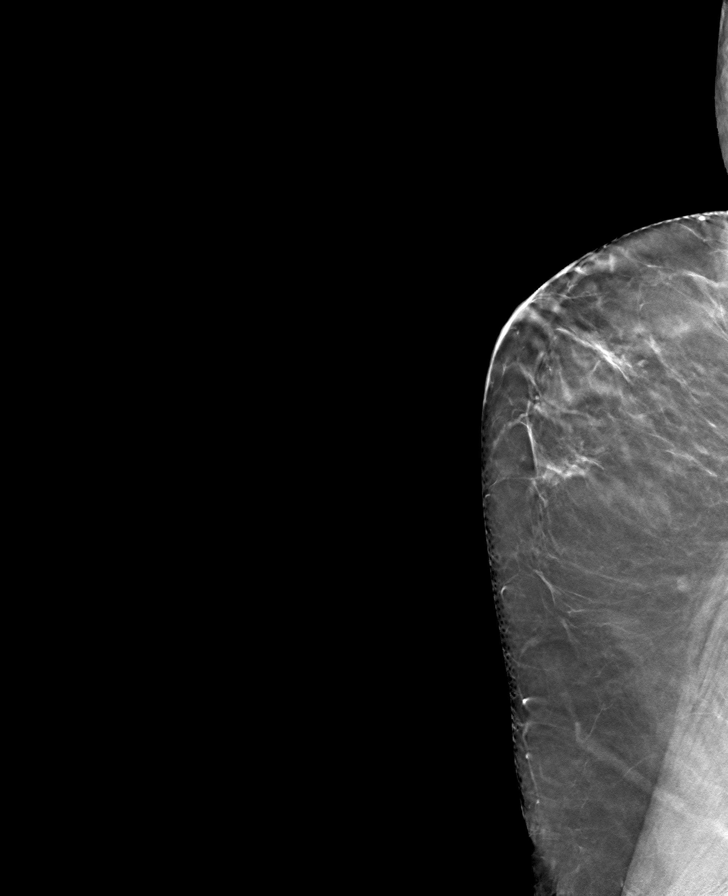

[L CC tomo · tomo slice 31/61.0]
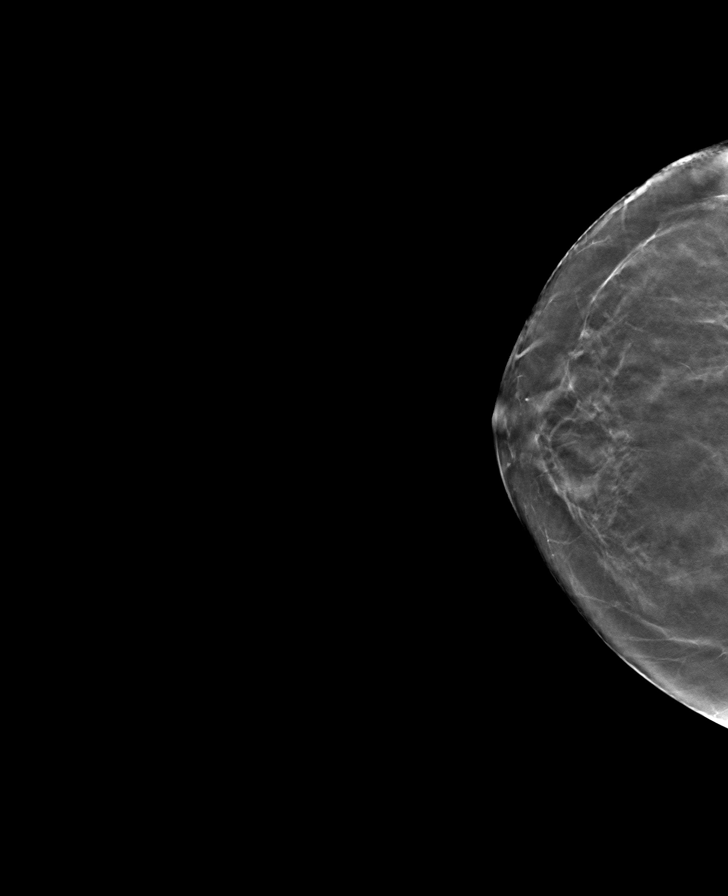

[R CC tomo · tomo slice 31/60.0]
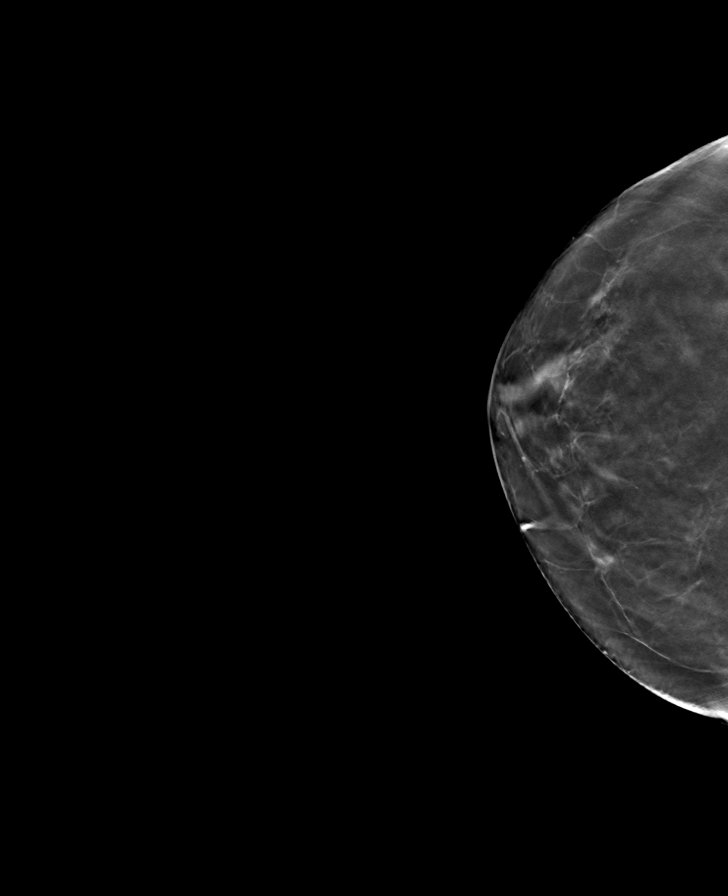

[R MLO tomo · tomo slice 34/67.0]
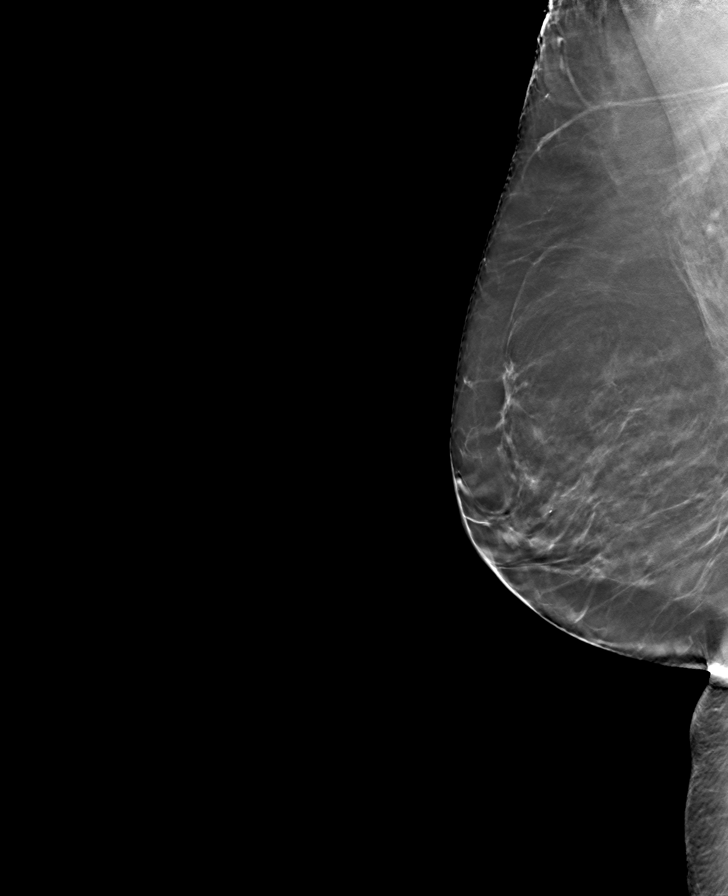

[8 of 24 positions shown; findings below may reference images not displayed]

ACR Breast Density Category b: There are scattered areas of
fibroglandular density.
FINDINGS: In the right breast, a possible mass warrants further evaluation. In
the left breast, no findings suspicious for malignancy.
IMPRESSION: Further evaluation is suggested for a possible mass in the right
breast.

RECOMMENDATION:
Diagnostic mammogram and possibly ultrasound of the right breast.
(Code:[id number]-E-WW6)

The patient will be contacted regarding the findings, and additional
imaging will be scheduled.

BI-RADS CATEGORY  0: Incomplete. Need additional imaging evaluation
and/or prior mammograms for comparison.

## 2021-11-28 DIAGNOSIS — E213 Hyperparathyroidism, unspecified: Secondary | ICD-10-CM | POA: Diagnosis not present

## 2021-11-28 DIAGNOSIS — M810 Age-related osteoporosis without current pathological fracture: Secondary | ICD-10-CM | POA: Diagnosis not present

## 2021-11-28 DIAGNOSIS — E673 Hypervitaminosis D: Secondary | ICD-10-CM | POA: Diagnosis not present

## 2021-11-28 DIAGNOSIS — E079 Disorder of thyroid, unspecified: Secondary | ICD-10-CM | POA: Diagnosis not present

## 2021-12-05 DIAGNOSIS — E213 Hyperparathyroidism, unspecified: Secondary | ICD-10-CM | POA: Diagnosis not present

## 2021-12-05 DIAGNOSIS — E782 Mixed hyperlipidemia: Secondary | ICD-10-CM | POA: Diagnosis not present

## 2021-12-05 DIAGNOSIS — E039 Hypothyroidism, unspecified: Secondary | ICD-10-CM | POA: Diagnosis not present

## 2021-12-05 DIAGNOSIS — M810 Age-related osteoporosis without current pathological fracture: Secondary | ICD-10-CM | POA: Diagnosis not present

## 2021-12-05 DIAGNOSIS — F32A Depression, unspecified: Secondary | ICD-10-CM | POA: Diagnosis not present

## 2021-12-05 DIAGNOSIS — Z79899 Other long term (current) drug therapy: Secondary | ICD-10-CM | POA: Diagnosis not present

## 2021-12-05 DIAGNOSIS — Z0000 Encounter for general adult medical examination without abnormal findings: Secondary | ICD-10-CM | POA: Diagnosis not present

## 2021-12-05 DIAGNOSIS — I10 Essential (primary) hypertension: Secondary | ICD-10-CM | POA: Diagnosis not present

## 2021-12-05 DIAGNOSIS — F419 Anxiety disorder, unspecified: Secondary | ICD-10-CM | POA: Diagnosis not present

## 2022-03-20 DIAGNOSIS — I10 Essential (primary) hypertension: Secondary | ICD-10-CM | POA: Diagnosis not present

## 2022-03-20 DIAGNOSIS — E039 Hypothyroidism, unspecified: Secondary | ICD-10-CM | POA: Diagnosis not present

## 2022-03-20 DIAGNOSIS — Z79899 Other long term (current) drug therapy: Secondary | ICD-10-CM | POA: Diagnosis not present

## 2022-03-20 DIAGNOSIS — E782 Mixed hyperlipidemia: Secondary | ICD-10-CM | POA: Diagnosis not present

## 2022-03-27 DIAGNOSIS — E039 Hypothyroidism, unspecified: Secondary | ICD-10-CM | POA: Diagnosis not present

## 2022-03-27 DIAGNOSIS — E782 Mixed hyperlipidemia: Secondary | ICD-10-CM | POA: Diagnosis not present

## 2022-03-27 DIAGNOSIS — H9202 Otalgia, left ear: Secondary | ICD-10-CM | POA: Diagnosis not present

## 2022-03-27 DIAGNOSIS — F419 Anxiety disorder, unspecified: Secondary | ICD-10-CM | POA: Diagnosis not present

## 2022-03-27 DIAGNOSIS — I10 Essential (primary) hypertension: Secondary | ICD-10-CM | POA: Diagnosis not present

## 2022-03-27 DIAGNOSIS — F32A Depression, unspecified: Secondary | ICD-10-CM | POA: Diagnosis not present

## 2022-03-27 DIAGNOSIS — M810 Age-related osteoporosis without current pathological fracture: Secondary | ICD-10-CM | POA: Diagnosis not present

## 2022-03-27 DIAGNOSIS — Z0000 Encounter for general adult medical examination without abnormal findings: Secondary | ICD-10-CM | POA: Diagnosis not present

## 2022-04-15 DIAGNOSIS — H6123 Impacted cerumen, bilateral: Secondary | ICD-10-CM | POA: Diagnosis not present

## 2022-04-15 DIAGNOSIS — H9202 Otalgia, left ear: Secondary | ICD-10-CM | POA: Diagnosis not present

## 2022-05-08 DIAGNOSIS — I10 Essential (primary) hypertension: Secondary | ICD-10-CM | POA: Diagnosis not present

## 2022-05-08 DIAGNOSIS — E079 Disorder of thyroid, unspecified: Secondary | ICD-10-CM | POA: Diagnosis not present

## 2022-05-08 DIAGNOSIS — F419 Anxiety disorder, unspecified: Secondary | ICD-10-CM | POA: Diagnosis not present

## 2022-05-08 DIAGNOSIS — F32A Depression, unspecified: Secondary | ICD-10-CM | POA: Diagnosis not present

## 2022-05-08 DIAGNOSIS — M810 Age-related osteoporosis without current pathological fracture: Secondary | ICD-10-CM | POA: Diagnosis not present

## 2022-05-08 DIAGNOSIS — Z79899 Other long term (current) drug therapy: Secondary | ICD-10-CM | POA: Diagnosis not present

## 2022-05-08 DIAGNOSIS — E7800 Pure hypercholesterolemia, unspecified: Secondary | ICD-10-CM | POA: Diagnosis not present

## 2022-05-09 DIAGNOSIS — H9202 Otalgia, left ear: Secondary | ICD-10-CM | POA: Diagnosis not present

## 2022-05-09 DIAGNOSIS — H6123 Impacted cerumen, bilateral: Secondary | ICD-10-CM | POA: Diagnosis not present

## 2022-08-07 DIAGNOSIS — E079 Disorder of thyroid, unspecified: Secondary | ICD-10-CM | POA: Diagnosis not present

## 2022-08-07 DIAGNOSIS — E7800 Pure hypercholesterolemia, unspecified: Secondary | ICD-10-CM | POA: Diagnosis not present

## 2022-08-07 DIAGNOSIS — I10 Essential (primary) hypertension: Secondary | ICD-10-CM | POA: Diagnosis not present

## 2022-08-07 DIAGNOSIS — Z79899 Other long term (current) drug therapy: Secondary | ICD-10-CM | POA: Diagnosis not present

## 2022-08-14 DIAGNOSIS — F419 Anxiety disorder, unspecified: Secondary | ICD-10-CM | POA: Diagnosis not present

## 2022-08-14 DIAGNOSIS — I10 Essential (primary) hypertension: Secondary | ICD-10-CM | POA: Diagnosis not present

## 2022-08-14 DIAGNOSIS — E039 Hypothyroidism, unspecified: Secondary | ICD-10-CM | POA: Diagnosis not present

## 2022-08-14 DIAGNOSIS — M810 Age-related osteoporosis without current pathological fracture: Secondary | ICD-10-CM | POA: Diagnosis not present

## 2022-08-14 DIAGNOSIS — E782 Mixed hyperlipidemia: Secondary | ICD-10-CM | POA: Diagnosis not present

## 2022-08-14 DIAGNOSIS — F32A Depression, unspecified: Secondary | ICD-10-CM | POA: Diagnosis not present

## 2022-08-22 DIAGNOSIS — M810 Age-related osteoporosis without current pathological fracture: Secondary | ICD-10-CM | POA: Diagnosis not present

## 2022-09-25 DIAGNOSIS — E673 Hypervitaminosis D: Secondary | ICD-10-CM | POA: Diagnosis not present

## 2022-09-25 DIAGNOSIS — M810 Age-related osteoporosis without current pathological fracture: Secondary | ICD-10-CM | POA: Diagnosis not present

## 2022-09-25 DIAGNOSIS — E213 Hyperparathyroidism, unspecified: Secondary | ICD-10-CM | POA: Diagnosis not present

## 2022-10-09 ENCOUNTER — Other Ambulatory Visit: Payer: Self-pay | Admitting: Internal Medicine

## 2022-10-09 DIAGNOSIS — Z1231 Encounter for screening mammogram for malignant neoplasm of breast: Secondary | ICD-10-CM

## 2022-10-30 DIAGNOSIS — H25813 Combined forms of age-related cataract, bilateral: Secondary | ICD-10-CM | POA: Diagnosis not present

## 2022-11-10 DIAGNOSIS — E673 Hypervitaminosis D: Secondary | ICD-10-CM | POA: Diagnosis not present

## 2022-11-10 DIAGNOSIS — M810 Age-related osteoporosis without current pathological fracture: Secondary | ICD-10-CM | POA: Diagnosis not present

## 2022-11-10 DIAGNOSIS — E213 Hyperparathyroidism, unspecified: Secondary | ICD-10-CM | POA: Diagnosis not present

## 2022-11-10 DIAGNOSIS — E039 Hypothyroidism, unspecified: Secondary | ICD-10-CM | POA: Diagnosis not present

## 2022-11-11 ENCOUNTER — Ambulatory Visit
Admission: RE | Admit: 2022-11-11 | Discharge: 2022-11-11 | Disposition: A | Discharge status: Home / Self Care | Payer: PPO | Source: Ambulatory Visit | Attending: Internal Medicine | Admitting: Internal Medicine

## 2022-11-11 DIAGNOSIS — Z1231 Encounter for screening mammogram for malignant neoplasm of breast: Secondary | ICD-10-CM | POA: Insufficient documentation

## 2022-11-17 DIAGNOSIS — E063 Autoimmune thyroiditis: Secondary | ICD-10-CM | POA: Diagnosis not present

## 2022-11-17 DIAGNOSIS — Z79899 Other long term (current) drug therapy: Secondary | ICD-10-CM | POA: Diagnosis not present

## 2022-11-17 DIAGNOSIS — F419 Anxiety disorder, unspecified: Secondary | ICD-10-CM | POA: Diagnosis not present

## 2022-11-17 DIAGNOSIS — M810 Age-related osteoporosis without current pathological fracture: Secondary | ICD-10-CM | POA: Diagnosis not present

## 2022-11-17 DIAGNOSIS — I10 Essential (primary) hypertension: Secondary | ICD-10-CM | POA: Diagnosis not present

## 2022-11-17 DIAGNOSIS — F32A Depression, unspecified: Secondary | ICD-10-CM | POA: Diagnosis not present

## 2022-11-17 DIAGNOSIS — H0011 Chalazion right upper eyelid: Secondary | ICD-10-CM | POA: Diagnosis not present

## 2022-11-17 DIAGNOSIS — E079 Disorder of thyroid, unspecified: Secondary | ICD-10-CM | POA: Diagnosis not present

## 2022-11-17 DIAGNOSIS — E782 Mixed hyperlipidemia: Secondary | ICD-10-CM | POA: Diagnosis not present

## 2022-11-25 DIAGNOSIS — H0011 Chalazion right upper eyelid: Secondary | ICD-10-CM | POA: Diagnosis not present

## 2022-11-26 DIAGNOSIS — S29012A Strain of muscle and tendon of back wall of thorax, initial encounter: Secondary | ICD-10-CM | POA: Diagnosis not present

## 2022-11-26 DIAGNOSIS — S46811A Strain of other muscles, fascia and tendons at shoulder and upper arm level, right arm, initial encounter: Secondary | ICD-10-CM | POA: Insufficient documentation

## 2023-01-07 DIAGNOSIS — S29012D Strain of muscle and tendon of back wall of thorax, subsequent encounter: Secondary | ICD-10-CM | POA: Diagnosis not present

## 2023-01-19 DIAGNOSIS — S29012A Strain of muscle and tendon of back wall of thorax, initial encounter: Secondary | ICD-10-CM | POA: Diagnosis not present

## 2023-01-19 DIAGNOSIS — S29012D Strain of muscle and tendon of back wall of thorax, subsequent encounter: Secondary | ICD-10-CM | POA: Diagnosis not present

## 2023-02-04 DIAGNOSIS — H2513 Age-related nuclear cataract, bilateral: Secondary | ICD-10-CM | POA: Diagnosis not present

## 2023-02-04 DIAGNOSIS — H52223 Regular astigmatism, bilateral: Secondary | ICD-10-CM | POA: Diagnosis not present

## 2023-02-04 DIAGNOSIS — H5213 Myopia, bilateral: Secondary | ICD-10-CM | POA: Diagnosis not present

## 2023-03-23 DIAGNOSIS — E782 Mixed hyperlipidemia: Secondary | ICD-10-CM | POA: Diagnosis not present

## 2023-03-23 DIAGNOSIS — Z79899 Other long term (current) drug therapy: Secondary | ICD-10-CM | POA: Diagnosis not present

## 2023-03-23 DIAGNOSIS — I10 Essential (primary) hypertension: Secondary | ICD-10-CM | POA: Diagnosis not present

## 2023-03-23 DIAGNOSIS — E079 Disorder of thyroid, unspecified: Secondary | ICD-10-CM | POA: Diagnosis not present

## 2023-03-30 DIAGNOSIS — I10 Essential (primary) hypertension: Secondary | ICD-10-CM | POA: Diagnosis not present

## 2023-03-30 DIAGNOSIS — F32A Depression, unspecified: Secondary | ICD-10-CM | POA: Diagnosis not present

## 2023-03-30 DIAGNOSIS — R0600 Dyspnea, unspecified: Secondary | ICD-10-CM | POA: Diagnosis not present

## 2023-03-30 DIAGNOSIS — E079 Disorder of thyroid, unspecified: Secondary | ICD-10-CM | POA: Diagnosis not present

## 2023-03-30 DIAGNOSIS — F419 Anxiety disorder, unspecified: Secondary | ICD-10-CM | POA: Diagnosis not present

## 2023-03-30 DIAGNOSIS — M810 Age-related osteoporosis without current pathological fracture: Secondary | ICD-10-CM | POA: Diagnosis not present

## 2023-03-30 DIAGNOSIS — R42 Dizziness and giddiness: Secondary | ICD-10-CM | POA: Diagnosis not present

## 2023-03-30 DIAGNOSIS — E213 Hyperparathyroidism, unspecified: Secondary | ICD-10-CM | POA: Diagnosis not present

## 2023-03-30 DIAGNOSIS — E782 Mixed hyperlipidemia: Secondary | ICD-10-CM | POA: Diagnosis not present

## 2023-03-30 DIAGNOSIS — Z0000 Encounter for general adult medical examination without abnormal findings: Secondary | ICD-10-CM | POA: Diagnosis not present

## 2023-04-01 DIAGNOSIS — G959 Disease of spinal cord, unspecified: Secondary | ICD-10-CM | POA: Diagnosis not present

## 2023-04-06 DIAGNOSIS — R918 Other nonspecific abnormal finding of lung field: Secondary | ICD-10-CM | POA: Diagnosis not present

## 2023-04-06 DIAGNOSIS — R9389 Abnormal findings on diagnostic imaging of other specified body structures: Secondary | ICD-10-CM | POA: Diagnosis not present

## 2023-04-11 ENCOUNTER — Emergency Department
Admission: EM | Admit: 2023-04-11 | Discharge: 2023-04-11 | Disposition: A | Discharge status: Home / Self Care | Payer: PPO | Attending: Emergency Medicine | Admitting: Emergency Medicine

## 2023-04-11 ENCOUNTER — Other Ambulatory Visit: Payer: Self-pay

## 2023-04-11 ENCOUNTER — Emergency Department: Payer: PPO

## 2023-04-11 ENCOUNTER — Encounter: Payer: Self-pay | Admitting: Emergency Medicine

## 2023-04-11 DIAGNOSIS — H02401 Unspecified ptosis of right eyelid: Secondary | ICD-10-CM | POA: Diagnosis not present

## 2023-04-11 DIAGNOSIS — R220 Localized swelling, mass and lump, head: Secondary | ICD-10-CM | POA: Diagnosis not present

## 2023-04-11 DIAGNOSIS — H5789 Other specified disorders of eye and adnexa: Secondary | ICD-10-CM | POA: Diagnosis present

## 2023-04-11 DIAGNOSIS — L03213 Periorbital cellulitis: Secondary | ICD-10-CM | POA: Diagnosis not present

## 2023-04-11 LAB — CBC WITH DIFFERENTIAL/PLATELET
Abs Immature Granulocytes: 0.02 10*3/uL (ref 0.00–0.07)
Basophils Absolute: 0 10*3/uL (ref 0.0–0.1)
Basophils Relative: 0 %
Eosinophils Absolute: 0 10*3/uL (ref 0.0–0.5)
Eosinophils Relative: 0 %
HCT: 40.1 % (ref 36.0–46.0)
Hemoglobin: 13 g/dL (ref 12.0–15.0)
Immature Granulocytes: 0 %
Lymphocytes Relative: 19 %
Lymphs Abs: 1.3 10*3/uL (ref 0.7–4.0)
MCH: 30.7 pg (ref 26.0–34.0)
MCHC: 32.4 g/dL (ref 30.0–36.0)
MCV: 94.6 fL (ref 80.0–100.0)
Monocytes Absolute: 0.4 10*3/uL (ref 0.1–1.0)
Monocytes Relative: 6 %
Neutro Abs: 5 10*3/uL (ref 1.7–7.7)
Neutrophils Relative %: 75 %
Platelets: 147 10*3/uL — ABNORMAL LOW (ref 150–400)
RBC: 4.24 MIL/uL (ref 3.87–5.11)
RDW: 13 % (ref 11.5–15.5)
WBC: 6.7 10*3/uL (ref 4.0–10.5)
nRBC: 0 % (ref 0.0–0.2)

## 2023-04-11 LAB — BASIC METABOLIC PANEL
Anion gap: 7 (ref 5–15)
BUN: 29 mg/dL — ABNORMAL HIGH (ref 8–23)
CO2: 25 mmol/L (ref 22–32)
Calcium: 10.3 mg/dL (ref 8.9–10.3)
Chloride: 107 mmol/L (ref 98–111)
Creatinine, Ser: 1 mg/dL (ref 0.44–1.00)
GFR, Estimated: >60 mL/min (ref >60)
Glucose, Bld: 127 mg/dL — ABNORMAL HIGH (ref 70–99)
Potassium: 3.4 mmol/L — ABNORMAL LOW (ref 3.5–5.1)
Sodium: 139 mmol/L (ref 135–145)

## 2023-04-11 MED ORDER — PROPARACAINE HCL 0.5 % OP SOLN
1.0000 [drp] | Freq: Once | OPHTHALMIC | Status: DC
  Filled 2023-04-11: qty 15

## 2023-04-11 MED ORDER — CEPHALEXIN 500 MG PO CAPS: 500.0000 mg | ORAL_CAPSULE | Freq: Four times a day (QID) | ORAL | 0 refills | Status: AC

## 2023-04-11 MED ORDER — FLUORESCEIN SODIUM 1 MG OP STRP
1.0000 | ORAL_STRIP | Freq: Once | OPHTHALMIC | Status: AC
  Administered 2023-04-11: 1 via OPHTHALMIC
  Filled 2023-04-11: qty 1

## 2023-04-11 MED ORDER — TETRACAINE HCL 0.5 % OP SOLN
1.0000 [drp] | Freq: Once | OPHTHALMIC | Status: AC
  Administered 2023-04-11: 1 [drp] via OPHTHALMIC
  Filled 2023-04-11: qty 4

## 2023-04-11 MED ORDER — DOXYCYCLINE MONOHYDRATE 100 MG PO TABS: 100.0000 mg | ORAL_TABLET | Freq: Two times a day (BID) | ORAL | 0 refills | Status: AC

## 2023-04-11 MED ORDER — IOHEXOL 300 MG/ML  SOLN
75.0000 mL | Freq: Once | INTRAMUSCULAR | Status: AC | PRN
  Administered 2023-04-11: 75 mL via INTRAVENOUS

## 2023-04-11 NOTE — ED Notes (Signed)
See triage note  Presents with right eye pain  and irritation  States sx's started yesterday denies any injury

## 2023-04-11 NOTE — Discharge Instructions (Signed)
Your CT scans show the swelling of your eyelid only, though no abscess, brain abnormalities, or infection behind your eye.  Please take antibiotics as prescribed.  Please follow-up with the ophthalmologist.  Please return for any new, worsening, or change in symptoms or other concerns.  It was a pleasure caring for you today.

## 2023-04-11 NOTE — ED Triage Notes (Signed)
Pt reports yesterday started with some itching to her right eye and then it became red and started with some swelling. Pt reports this am the redness and swelling was worse and now it is painful. Denies obvious injuries or foreign bodies.

## 2023-04-11 NOTE — ED Provider Notes (Signed)
[facility] Provider Note    Event Date/Time   First MD Initiated Contact with Patient 04/11/23 [phone]     (approximate)   History   Eye Problem   HPI  Audrey Finley is a 72 y.o. female who presents today for evaluation of right eye swelling.  Patient reports that 2 days ago her eye was very itchy and she was rubbing her eye a lot.  She reports that yesterday she developed swelling to her upper eyelid as well as slightly below her eye.  She denies any visual changes.  No fevers or chills.  No eye pain.  No headache.  No diplopia.  She reports that she normally wears contact lenses but she has not been wearing her contact lenses for the past few days.  She denies dizziness.  Patient Active Problem List   Diagnosis Date Noted   Vitamin B12 deficiency 02/28/2018          Physical Exam   Triage Vital Signs: ED Triage Vitals  Enc Vitals Group     BP 04/11/23 0748 (!) 124/101     Pulse Rate 04/11/23 0748 83     Resp 04/11/23 0748 16     Temp 04/11/23 0748 97.9 F (36.6 C)     Temp Source 04/11/23 0748 Oral     SpO2 04/11/23 0748 99 %     Weight 04/11/23 0743 180 lb 12.4 oz (82 kg)     Height 04/11/23 0743 5\' 6"  (1.676 m)     Head Circumference --      Peak Flow --      Pain Score 04/11/23 0743 6     Pain Loc --      Pain Edu? --      Excl. in GC? --     Most recent vital signs: Vitals:   04/11/23 0748 04/11/23 0939  BP: (!) 124/101 (!) 120/90  Pulse: 83 80  Resp: 16 16  Temp: 97.9 F (36.6 C)   SpO2: 99% 99%    Physical Exam Vitals and nursing note reviewed.  Constitutional:      General: Awake and alert. No acute distress.    Appearance: Normal appearance. The patient is normal weight.  HENT:     Head: Normocephalic and atraumatic.     Mouth: Mucous membranes are moist.  Eyes:     General: PERRL. Normal EOMs        Right eye: No discharge.    Right upper eyelid swelling and mild swelling to the infraorbital area.  There is a  conjugate gaze.  There is no anisocoria.  There is no scleral injection or conjunctival changes.  She has full and painless extraocular movements.  No discharge.  No proptosis.  No fluorescein uptake.  No foreign body.  No chalazion or hordeolum.  Normal ocular pressure of 14.  No afferent pupillary defect.  No resistance with retropulsion.    Left eye: No discharge.     Conjunctiva/sclera: Conjunctivae normal.  Cardiovascular:     Rate and Rhythm: Normal rate and regular rhythm.     Pulses: Normal pulses.  Pulmonary:     Effort: Pulmonary effort is normal. No respiratory distress.     Breath sounds: Normal breath sounds.  Abdominal:     Abdomen is soft. There is no abdominal tenderness.  Musculoskeletal:        General: No swelling. Normal range of motion.     Cervical back: Normal range of motion  and neck supple.  Skin:    General: Skin is warm and dry.     Capillary Refill: Capillary refill takes less than 2 seconds.     Findings: No rash.  Neurological:     Mental Status: The patient is awake and alert.   Neurological: GCS 15 alert and oriented x3 Normal speech, no expressive or receptive aphasia or dysarthria Cranial nerves II through XII intact Normal visual fields 5 out of 5 strength in all 4 extremities with intact sensation throughout No extremity drift Normal finger-to-nose testing, no limb or truncal ataxia    ED Results / Procedures / Treatments   Labs (all labs ordered are listed, but only abnormal results are displayed) Labs Reviewed  BASIC METABOLIC PANEL - Abnormal; Notable for the following components:      Result Value   Potassium 3.4 (*)    Glucose, Bld 127 (*)    BUN 29 (*)    All other components within normal limits  CBC WITH DIFFERENTIAL/PLATELET - Abnormal; Notable for the following components:   Platelets 147 (*)    All other components within normal limits     EKG     RADIOLOGY I independently reviewed and interpreted imaging and agree  with radiologists findings.     PROCEDURES:  Critical Care performed:   Procedures   MEDICATIONS ORDERED IN ED: Medications  fluorescein ophthalmic strip 1 strip (1 strip Right Eye Given 04/11/23 0811)  tetracaine (PONTOCAINE) 0.5 % ophthalmic solution 1 drop (1 drop Right Eye Given by Other 04/11/23 0811)  iohexol (OMNIPAQUE) 300 MG/ML solution 75 mL (75 mLs Intravenous Contrast Given 04/11/23 0847)     IMPRESSION / MDM / ASSESSMENT AND PLAN / ED COURSE  I reviewed the triage vital signs and the nursing notes.   Differential diagnosis includes, but is not limited to, periorbital/preseptal cellulitis, orbital cellulitis, blepharitis, retained foreign body, ptosis.  Patient is awake and alert, hemodynamically stable and afebrile.  She is neurologically intact.  Patient has obvious swelling of her upper eyelid and in for a area.  There is no erythema.  She has full and normal extraocular movements without pain.  There is no fluorescein uptake.  She has normal ocular pressure of 14, not consistent with acute angle-closure glaucoma.  She has no temporal artery tenderness or visual changes or headache to suggest temporal arteritis.  She reports that she has bad eyesight in general, and does not have her eyeglasses with her, and subsequently declined any visual acuity.  She is adamant that there is no change in her vision.  Her history of itching her eye prior to the onset of her swelling is consistent with what sounds like a blepharitis that has become a preseptal cellulitis.  She agreed to further workup to ensure we are not missing alternative etiology.  CT orbits obtained are without evidence of orbital cellulitis or abscess.  CT head obtained demonstrates no intracranial abnormality that may result in a ptosis.  There is no myosis or anhydrosis, do not suspect Horner syndrome.  There is no anisocoria or afferent pupillary defect.  I do suspect that the ptosis is due to eyelid swelling which  is secondary to the preseptal cellulitis.  However I did recommend close outpatient follow-up with ophthalmology, and she reports that she has an ophthalmologist that she is able to see this week.  In the meantime, she started on antibiotics for her preseptal cellulitis.  We discussed strict return precautions and the importance of close outpatient  follow-up.  Patient understands and agrees with plan.  She was discharged in stable condition.   Patient's presentation is most consistent with acute complicated illness / injury requiring diagnostic workup.    FINAL CLINICAL IMPRESSION(S) / ED DIAGNOSES   Final diagnoses:  Preseptal cellulitis of right upper eyelid     Rx / DC Orders   ED Discharge Orders          Ordered    cephALEXin (KEFLEX) 500 MG capsule  4 times daily        04/11/23 0921    doxycycline (ADOXA) 100 MG tablet  2 times daily        04/11/23 4098             Note:  This document was prepared using Dragon voice recognition software and may include unintentional dictation errors.   Keturah Shavers 04/11/23 1008    Chesley Noon, MD 04/11/23 1408

## 2023-04-15 DIAGNOSIS — G959 Disease of spinal cord, unspecified: Secondary | ICD-10-CM | POA: Diagnosis not present

## 2023-04-15 DIAGNOSIS — M47812 Spondylosis without myelopathy or radiculopathy, cervical region: Secondary | ICD-10-CM | POA: Diagnosis not present

## 2023-04-20 DIAGNOSIS — L03213 Periorbital cellulitis: Secondary | ICD-10-CM | POA: Diagnosis not present

## 2023-04-22 DIAGNOSIS — R42 Dizziness and giddiness: Secondary | ICD-10-CM | POA: Diagnosis not present

## 2023-04-22 DIAGNOSIS — I6523 Occlusion and stenosis of bilateral carotid arteries: Secondary | ICD-10-CM | POA: Diagnosis not present

## 2023-05-04 DIAGNOSIS — R0689 Other abnormalities of breathing: Secondary | ICD-10-CM | POA: Diagnosis not present

## 2023-05-04 DIAGNOSIS — R0602 Shortness of breath: Secondary | ICD-10-CM | POA: Diagnosis not present

## 2023-05-04 DIAGNOSIS — Z6826 Body mass index (BMI) 26.0-26.9, adult: Secondary | ICD-10-CM | POA: Diagnosis not present

## 2023-05-04 DIAGNOSIS — M4722 Other spondylosis with radiculopathy, cervical region: Secondary | ICD-10-CM | POA: Diagnosis not present

## 2023-05-04 DIAGNOSIS — R0600 Dyspnea, unspecified: Secondary | ICD-10-CM | POA: Diagnosis not present

## 2023-05-07 ENCOUNTER — Other Ambulatory Visit: Payer: Self-pay | Admitting: Pulmonary Disease

## 2023-05-07 DIAGNOSIS — R0600 Dyspnea, unspecified: Secondary | ICD-10-CM

## 2023-05-07 DIAGNOSIS — R0602 Shortness of breath: Secondary | ICD-10-CM

## 2023-05-11 ENCOUNTER — Encounter: Payer: Self-pay | Admitting: Oncology

## 2023-05-13 ENCOUNTER — Ambulatory Visit
Admission: RE | Admit: 2023-05-13 | Discharge: 2023-05-13 | Disposition: A | Discharge status: Home / Self Care | Payer: PPO | Source: Ambulatory Visit | Attending: Pulmonary Disease | Admitting: Pulmonary Disease

## 2023-05-13 DIAGNOSIS — R0600 Dyspnea, unspecified: Secondary | ICD-10-CM | POA: Insufficient documentation

## 2023-05-13 DIAGNOSIS — R0602 Shortness of breath: Secondary | ICD-10-CM | POA: Diagnosis not present

## 2023-05-13 DIAGNOSIS — R0689 Other abnormalities of breathing: Secondary | ICD-10-CM | POA: Insufficient documentation

## 2023-05-13 MED ORDER — IOHEXOL 350 MG/ML SOLN
75.0000 mL | Freq: Once | INTRAVENOUS | Status: AC | PRN
  Administered 2023-05-13: 75 mL via INTRAVENOUS

## 2023-05-15 DIAGNOSIS — R0600 Dyspnea, unspecified: Secondary | ICD-10-CM | POA: Diagnosis not present

## 2023-05-15 DIAGNOSIS — R0689 Other abnormalities of breathing: Secondary | ICD-10-CM | POA: Diagnosis not present

## 2023-06-17 ENCOUNTER — Other Ambulatory Visit: Payer: Self-pay | Admitting: Physician Assistant

## 2023-06-17 DIAGNOSIS — I2510 Atherosclerotic heart disease of native coronary artery without angina pectoris: Secondary | ICD-10-CM | POA: Diagnosis not present

## 2023-06-17 DIAGNOSIS — I10 Essential (primary) hypertension: Secondary | ICD-10-CM | POA: Diagnosis not present

## 2023-06-17 DIAGNOSIS — E782 Mixed hyperlipidemia: Secondary | ICD-10-CM | POA: Diagnosis not present

## 2023-06-17 DIAGNOSIS — R0609 Other forms of dyspnea: Secondary | ICD-10-CM | POA: Insufficient documentation

## 2023-06-17 DIAGNOSIS — R0602 Shortness of breath: Secondary | ICD-10-CM | POA: Diagnosis not present

## 2023-06-17 DIAGNOSIS — I2584 Coronary atherosclerosis due to calcified coronary lesion: Secondary | ICD-10-CM | POA: Diagnosis not present

## 2023-06-17 DIAGNOSIS — R0789 Other chest pain: Secondary | ICD-10-CM | POA: Diagnosis not present

## 2023-07-01 ENCOUNTER — Other Ambulatory Visit (HOSPITAL_COMMUNITY): Payer: Self-pay | Admitting: Emergency Medicine

## 2023-07-01 ENCOUNTER — Encounter (HOSPITAL_COMMUNITY): Payer: Self-pay

## 2023-07-01 DIAGNOSIS — R079 Chest pain, unspecified: Secondary | ICD-10-CM

## 2023-07-01 MED ORDER — METOPROLOL TARTRATE 100 MG PO TABS: 100.0000 mg | ORAL_TABLET | Freq: Once | ORAL | 0 refills | Status: DC

## 2023-07-01 MED ORDER — IVABRADINE HCL 5 MG PO TABS: 15.0000 mg | ORAL_TABLET | Freq: Once | ORAL | 0 refills | Status: AC

## 2023-07-06 ENCOUNTER — Ambulatory Visit
Admission: RE | Admit: 2023-07-06 | Discharge: 2023-07-06 | Disposition: A | Discharge status: Home / Self Care | Payer: PPO | Source: Ambulatory Visit | Attending: Physician Assistant | Admitting: Physician Assistant

## 2023-07-06 DIAGNOSIS — I10 Essential (primary) hypertension: Secondary | ICD-10-CM | POA: Diagnosis not present

## 2023-07-06 DIAGNOSIS — I2584 Coronary atherosclerosis due to calcified coronary lesion: Secondary | ICD-10-CM | POA: Insufficient documentation

## 2023-07-06 DIAGNOSIS — R0609 Other forms of dyspnea: Secondary | ICD-10-CM | POA: Diagnosis not present

## 2023-07-06 DIAGNOSIS — R0789 Other chest pain: Secondary | ICD-10-CM | POA: Insufficient documentation

## 2023-07-06 DIAGNOSIS — I2510 Atherosclerotic heart disease of native coronary artery without angina pectoris: Secondary | ICD-10-CM | POA: Insufficient documentation

## 2023-07-06 DIAGNOSIS — E782 Mixed hyperlipidemia: Secondary | ICD-10-CM | POA: Insufficient documentation

## 2023-07-06 MED ORDER — IOHEXOL 350 MG/ML SOLN
100.0000 mL | Freq: Once | INTRAVENOUS | Status: AC | PRN
  Administered 2023-07-06: 100 mL via INTRAVENOUS

## 2023-07-06 MED ORDER — NITROGLYCERIN 0.4 MG SL SUBL
SUBLINGUAL_TABLET | SUBLINGUAL | Status: AC
  Filled 2023-07-06: qty 1

## 2023-07-06 MED ORDER — NITROGLYCERIN 0.4 MG SL SUBL
0.8000 mg | SUBLINGUAL_TABLET | Freq: Once | SUBLINGUAL | Status: AC
  Administered 2023-07-06: 0.8 mg via SUBLINGUAL
  Filled 2023-07-06: qty 25

## 2023-07-06 NOTE — Progress Notes (Signed)
Patient tolerated procedure well. Ambulate w/o difficulty. Denies any lightheadedness or being dizzy. Pt denies any pain at this time. Sitting in chair. Pt is encouraged to drink additional water throughout the day and reason explained to patient. Patient verbalized understanding and all questions answered. ABC intact. No further needs at this time. Discharge from procedure area w/o issues. 

## 2023-07-22 DIAGNOSIS — G4719 Other hypersomnia: Secondary | ICD-10-CM | POA: Diagnosis not present

## 2023-07-22 DIAGNOSIS — R0609 Other forms of dyspnea: Secondary | ICD-10-CM | POA: Diagnosis not present

## 2023-07-30 DIAGNOSIS — E039 Hypothyroidism, unspecified: Secondary | ICD-10-CM | POA: Diagnosis not present

## 2023-07-30 DIAGNOSIS — F419 Anxiety disorder, unspecified: Secondary | ICD-10-CM | POA: Diagnosis not present

## 2023-07-30 DIAGNOSIS — I10 Essential (primary) hypertension: Secondary | ICD-10-CM | POA: Diagnosis not present

## 2023-07-30 DIAGNOSIS — M810 Age-related osteoporosis without current pathological fracture: Secondary | ICD-10-CM | POA: Diagnosis not present

## 2023-07-30 DIAGNOSIS — Z79899 Other long term (current) drug therapy: Secondary | ICD-10-CM | POA: Diagnosis not present

## 2023-07-30 DIAGNOSIS — F32A Depression, unspecified: Secondary | ICD-10-CM | POA: Diagnosis not present

## 2023-07-30 DIAGNOSIS — R5383 Other fatigue: Secondary | ICD-10-CM | POA: Diagnosis not present

## 2023-07-30 DIAGNOSIS — E782 Mixed hyperlipidemia: Secondary | ICD-10-CM | POA: Diagnosis not present

## 2023-07-30 DIAGNOSIS — R5381 Other malaise: Secondary | ICD-10-CM | POA: Diagnosis not present

## 2023-07-30 DIAGNOSIS — Z1231 Encounter for screening mammogram for malignant neoplasm of breast: Secondary | ICD-10-CM | POA: Diagnosis not present

## 2023-07-31 ENCOUNTER — Other Ambulatory Visit: Payer: Self-pay | Admitting: Internal Medicine

## 2023-07-31 DIAGNOSIS — Z1231 Encounter for screening mammogram for malignant neoplasm of breast: Secondary | ICD-10-CM

## 2023-08-19 DIAGNOSIS — K449 Diaphragmatic hernia without obstruction or gangrene: Secondary | ICD-10-CM | POA: Diagnosis not present

## 2023-08-19 DIAGNOSIS — K219 Gastro-esophageal reflux disease without esophagitis: Secondary | ICD-10-CM | POA: Diagnosis not present

## 2023-08-19 DIAGNOSIS — R0609 Other forms of dyspnea: Secondary | ICD-10-CM | POA: Diagnosis not present

## 2023-08-21 DIAGNOSIS — M1711 Unilateral primary osteoarthritis, right knee: Secondary | ICD-10-CM | POA: Insufficient documentation

## 2023-08-21 DIAGNOSIS — M25561 Pain in right knee: Secondary | ICD-10-CM | POA: Insufficient documentation

## 2023-08-21 DIAGNOSIS — M13861 Other specified arthritis, right knee: Secondary | ICD-10-CM | POA: Diagnosis not present

## 2023-08-25 ENCOUNTER — Other Ambulatory Visit: Payer: Self-pay | Admitting: Gastroenterology

## 2023-08-25 DIAGNOSIS — K219 Gastro-esophageal reflux disease without esophagitis: Secondary | ICD-10-CM

## 2023-08-25 DIAGNOSIS — K449 Diaphragmatic hernia without obstruction or gangrene: Secondary | ICD-10-CM

## 2023-08-27 ENCOUNTER — Ambulatory Visit
Admission: RE | Admit: 2023-08-27 | Discharge: 2023-08-27 | Disposition: A | Discharge status: Home / Self Care | Payer: PPO | Source: Ambulatory Visit | Attending: Gastroenterology | Admitting: Gastroenterology

## 2023-08-27 DIAGNOSIS — K219 Gastro-esophageal reflux disease without esophagitis: Secondary | ICD-10-CM | POA: Diagnosis not present

## 2023-08-27 DIAGNOSIS — K449 Diaphragmatic hernia without obstruction or gangrene: Secondary | ICD-10-CM | POA: Insufficient documentation

## 2023-08-28 DIAGNOSIS — M25561 Pain in right knee: Secondary | ICD-10-CM | POA: Diagnosis not present

## 2023-09-03 DIAGNOSIS — R0602 Shortness of breath: Secondary | ICD-10-CM | POA: Diagnosis not present

## 2023-09-04 DIAGNOSIS — M25561 Pain in right knee: Secondary | ICD-10-CM | POA: Diagnosis not present

## 2023-09-07 DIAGNOSIS — M1711 Unilateral primary osteoarthritis, right knee: Secondary | ICD-10-CM | POA: Diagnosis not present

## 2023-09-22 DIAGNOSIS — G4719 Other hypersomnia: Secondary | ICD-10-CM | POA: Diagnosis not present

## 2023-09-24 DIAGNOSIS — G4733 Obstructive sleep apnea (adult) (pediatric): Secondary | ICD-10-CM | POA: Diagnosis not present

## 2023-10-05 DIAGNOSIS — R0602 Shortness of breath: Secondary | ICD-10-CM | POA: Diagnosis not present

## 2023-10-05 DIAGNOSIS — R0609 Other forms of dyspnea: Secondary | ICD-10-CM | POA: Diagnosis not present

## 2023-10-14 ENCOUNTER — Other Ambulatory Visit: Payer: Self-pay | Admitting: Specialist

## 2023-10-14 DIAGNOSIS — K219 Gastro-esophageal reflux disease without esophagitis: Secondary | ICD-10-CM | POA: Diagnosis not present

## 2023-10-14 DIAGNOSIS — K449 Diaphragmatic hernia without obstruction or gangrene: Secondary | ICD-10-CM | POA: Diagnosis not present

## 2023-10-14 DIAGNOSIS — R0609 Other forms of dyspnea: Secondary | ICD-10-CM

## 2023-10-15 DIAGNOSIS — M7989 Other specified soft tissue disorders: Secondary | ICD-10-CM | POA: Diagnosis not present

## 2023-10-15 DIAGNOSIS — E039 Hypothyroidism, unspecified: Secondary | ICD-10-CM | POA: Diagnosis not present

## 2023-10-15 DIAGNOSIS — I10 Essential (primary) hypertension: Secondary | ICD-10-CM | POA: Diagnosis not present

## 2023-10-16 ENCOUNTER — Ambulatory Visit: Payer: PPO | Attending: Specialist

## 2023-10-16 DIAGNOSIS — R0609 Other forms of dyspnea: Secondary | ICD-10-CM | POA: Diagnosis not present

## 2023-10-16 LAB — PULMONARY FUNCTION TEST ARMC ONLY
FEF 25-75 Post: 2.38 L/s
FEF 25-75 Pre: 3.42 L/s
FEF2575-%Change-Post: -30 %
FEF2575-%Pred-Post: 120 %
FEF2575-%Pred-Pre: 173 %
FEV1-%Change-Post: -6 %
FEV1-%Pred-Post: 83 %
FEV1-%Pred-Pre: 88 %
FEV1-Post: 2.02 L
FEV1-Pre: 2.16 L
FEV1FVC-%Change-Post: -5 %
FEV1FVC-%Pred-Pre: 119 %
FEV6-%Change-Post: 0 %
FEV6-%Pred-Post: 77 %
FEV6-%Pred-Pre: 77 %
FEV6-Post: 2.37 L
FEV6-Pre: 2.39 L
FEV6FVC-%Pred-Post: 104 %
FEV6FVC-%Pred-Pre: 104 %
FVC-%Change-Post: 0 %
FVC-%Pred-Post: 74 %
FVC-%Pred-Pre: 74 %
FVC-Post: 2.38 L
FVC-Pre: 2.39 L
Post FEV1/FVC ratio: 85 %
Post FEV6/FVC ratio: 100 %
Pre FEV1/FVC ratio: 90 %
Pre FEV6/FVC Ratio: 100 %

## 2023-10-16 MED ORDER — ALBUTEROL SULFATE (2.5 MG/3ML) 0.083% IN NEBU
2.5000 mg | INHALATION_SOLUTION | Freq: Once | RESPIRATORY_TRACT | Status: AC
  Administered 2023-10-16: 2.5 mg via RESPIRATORY_TRACT
  Filled 2023-10-16: qty 3

## 2023-10-16 MED ORDER — METHACHOLINE 16 MG/ML NEB SOLN
3.0000 mL | Freq: Once | RESPIRATORY_TRACT | Status: AC
  Administered 2023-10-16: 48 mg via RESPIRATORY_TRACT
  Filled 2023-10-16: qty 3

## 2023-10-16 MED ORDER — METHACHOLINE 0.0625 MG/ML NEB SOLN
3.0000 mL | Freq: Once | RESPIRATORY_TRACT | Status: AC
  Administered 2023-10-16: 0.1875 mg via RESPIRATORY_TRACT
  Filled 2023-10-16: qty 3

## 2023-10-16 MED ORDER — METHACHOLINE 0 MG/ML NEB SOLN
3.0000 mL | Freq: Once | RESPIRATORY_TRACT | Status: AC
  Administered 2023-10-16: 3 mL via RESPIRATORY_TRACT
  Filled 2023-10-16: qty 3

## 2023-10-16 MED ORDER — METHACHOLINE 4 MG/ML NEB SOLN
3.0000 mL | Freq: Once | RESPIRATORY_TRACT | Status: AC
  Administered 2023-10-16: 12 mg via RESPIRATORY_TRACT
  Filled 2023-10-16: qty 3

## 2023-10-16 MED ORDER — METHACHOLINE 0.25 MG/ML NEB SOLN
3.0000 mL | Freq: Once | RESPIRATORY_TRACT | Status: AC
  Administered 2023-10-16: 0.75 mg via RESPIRATORY_TRACT
  Filled 2023-10-16: qty 3

## 2023-10-16 MED ORDER — METHACHOLINE 1 MG/ML NEB SOLN
3.0000 mL | Freq: Once | RESPIRATORY_TRACT | Status: AC
  Administered 2023-10-16: 3 mg via RESPIRATORY_TRACT
  Filled 2023-10-16: qty 3

## 2023-10-19 DIAGNOSIS — M1711 Unilateral primary osteoarthritis, right knee: Secondary | ICD-10-CM | POA: Diagnosis not present

## 2023-11-02 DIAGNOSIS — M1711 Unilateral primary osteoarthritis, right knee: Secondary | ICD-10-CM | POA: Diagnosis not present

## 2023-11-12 DIAGNOSIS — I10 Essential (primary) hypertension: Secondary | ICD-10-CM | POA: Diagnosis not present

## 2023-11-13 ENCOUNTER — Ambulatory Visit
Admission: RE | Admit: 2023-11-13 | Discharge: 2023-11-13 | Disposition: A | Discharge status: Home / Self Care | Payer: PPO | Source: Ambulatory Visit | Attending: Internal Medicine | Admitting: Internal Medicine

## 2023-11-13 DIAGNOSIS — Z1231 Encounter for screening mammogram for malignant neoplasm of breast: Secondary | ICD-10-CM | POA: Diagnosis not present

## 2023-11-19 DIAGNOSIS — I10 Essential (primary) hypertension: Secondary | ICD-10-CM | POA: Diagnosis not present

## 2023-11-19 DIAGNOSIS — E039 Hypothyroidism, unspecified: Secondary | ICD-10-CM | POA: Diagnosis not present

## 2023-11-19 DIAGNOSIS — Z79899 Other long term (current) drug therapy: Secondary | ICD-10-CM | POA: Diagnosis not present

## 2023-11-19 DIAGNOSIS — F32A Depression, unspecified: Secondary | ICD-10-CM | POA: Diagnosis not present

## 2023-11-19 DIAGNOSIS — E782 Mixed hyperlipidemia: Secondary | ICD-10-CM | POA: Diagnosis not present

## 2023-11-19 DIAGNOSIS — F419 Anxiety disorder, unspecified: Secondary | ICD-10-CM | POA: Diagnosis not present

## 2023-11-26 DIAGNOSIS — M810 Age-related osteoporosis without current pathological fracture: Secondary | ICD-10-CM | POA: Diagnosis not present

## 2023-11-26 DIAGNOSIS — E673 Hypervitaminosis D: Secondary | ICD-10-CM | POA: Diagnosis not present

## 2023-12-01 DIAGNOSIS — M810 Age-related osteoporosis without current pathological fracture: Secondary | ICD-10-CM | POA: Diagnosis not present

## 2023-12-07 DIAGNOSIS — R0600 Dyspnea, unspecified: Secondary | ICD-10-CM | POA: Diagnosis not present

## 2023-12-14 DIAGNOSIS — M1711 Unilateral primary osteoarthritis, right knee: Secondary | ICD-10-CM | POA: Diagnosis not present

## 2023-12-17 DIAGNOSIS — K219 Gastro-esophageal reflux disease without esophagitis: Secondary | ICD-10-CM | POA: Diagnosis not present

## 2023-12-17 DIAGNOSIS — K449 Diaphragmatic hernia without obstruction or gangrene: Secondary | ICD-10-CM | POA: Diagnosis not present

## 2023-12-29 DIAGNOSIS — F32A Depression, unspecified: Secondary | ICD-10-CM | POA: Diagnosis not present

## 2023-12-29 DIAGNOSIS — E785 Hyperlipidemia, unspecified: Secondary | ICD-10-CM | POA: Diagnosis not present

## 2023-12-29 DIAGNOSIS — E559 Vitamin D deficiency, unspecified: Secondary | ICD-10-CM | POA: Diagnosis not present

## 2023-12-29 DIAGNOSIS — F419 Anxiety disorder, unspecified: Secondary | ICD-10-CM | POA: Diagnosis not present

## 2023-12-29 DIAGNOSIS — M810 Age-related osteoporosis without current pathological fracture: Secondary | ICD-10-CM | POA: Diagnosis not present

## 2023-12-29 DIAGNOSIS — I10 Essential (primary) hypertension: Secondary | ICD-10-CM | POA: Diagnosis not present

## 2023-12-29 DIAGNOSIS — E039 Hypothyroidism, unspecified: Secondary | ICD-10-CM | POA: Diagnosis not present

## 2023-12-29 DIAGNOSIS — Z636 Dependent relative needing care at home: Secondary | ICD-10-CM | POA: Diagnosis not present

## 2023-12-29 DIAGNOSIS — E538 Deficiency of other specified B group vitamins: Secondary | ICD-10-CM | POA: Diagnosis not present

## 2024-01-03 ENCOUNTER — Encounter: Payer: Self-pay | Admitting: Oncology

## 2024-01-18 DIAGNOSIS — M1711 Unilateral primary osteoarthritis, right knee: Secondary | ICD-10-CM | POA: Diagnosis not present

## 2024-01-19 DIAGNOSIS — M25561 Pain in right knee: Secondary | ICD-10-CM | POA: Diagnosis not present

## 2024-01-19 DIAGNOSIS — M1711 Unilateral primary osteoarthritis, right knee: Secondary | ICD-10-CM | POA: Diagnosis not present

## 2024-01-25 DIAGNOSIS — M1711 Unilateral primary osteoarthritis, right knee: Secondary | ICD-10-CM | POA: Diagnosis not present

## 2024-02-01 DIAGNOSIS — M1711 Unilateral primary osteoarthritis, right knee: Secondary | ICD-10-CM | POA: Diagnosis not present

## 2024-02-11 ENCOUNTER — Ambulatory Visit

## 2024-02-11 DIAGNOSIS — K449 Diaphragmatic hernia without obstruction or gangrene: Secondary | ICD-10-CM | POA: Diagnosis not present

## 2024-02-11 DIAGNOSIS — K2970 Gastritis, unspecified, without bleeding: Secondary | ICD-10-CM | POA: Diagnosis not present

## 2024-02-11 DIAGNOSIS — K219 Gastro-esophageal reflux disease without esophagitis: Secondary | ICD-10-CM | POA: Diagnosis not present

## 2024-02-11 DIAGNOSIS — K2950 Unspecified chronic gastritis without bleeding: Secondary | ICD-10-CM | POA: Diagnosis not present

## 2024-03-03 ENCOUNTER — Encounter: Payer: Self-pay | Admitting: Oncology

## 2024-03-09 ENCOUNTER — Ambulatory Visit: Admitting: Podiatry

## 2024-03-09 ENCOUNTER — Encounter: Payer: Self-pay | Admitting: Podiatry

## 2024-03-09 ENCOUNTER — Ambulatory Visit (INDEPENDENT_AMBULATORY_CARE_PROVIDER_SITE_OTHER)

## 2024-03-09 DIAGNOSIS — D2371 Other benign neoplasm of skin of right lower limb, including hip: Secondary | ICD-10-CM | POA: Diagnosis not present

## 2024-03-09 DIAGNOSIS — M2040 Other hammer toe(s) (acquired), unspecified foot: Secondary | ICD-10-CM

## 2024-03-09 DIAGNOSIS — M2041 Other hammer toe(s) (acquired), right foot: Secondary | ICD-10-CM | POA: Diagnosis not present

## 2024-03-09 DIAGNOSIS — M51379 Other intervertebral disc degeneration, lumbosacral region without mention of lumbar back pain or lower extremity pain: Secondary | ICD-10-CM | POA: Insufficient documentation

## 2024-03-09 DIAGNOSIS — M898X7 Other specified disorders of bone, ankle and foot: Secondary | ICD-10-CM | POA: Diagnosis not present

## 2024-03-09 DIAGNOSIS — M5030 Other cervical disc degeneration, unspecified cervical region: Secondary | ICD-10-CM | POA: Insufficient documentation

## 2024-03-09 DIAGNOSIS — M1990 Unspecified osteoarthritis, unspecified site: Secondary | ICD-10-CM | POA: Insufficient documentation

## 2024-03-09 NOTE — Progress Notes (Signed)
 Subjective:  Patient ID: Audrey Finley, female    DOB: 04/15/1951,  MRN: [id number] HPI Chief Complaint  Patient presents with   Toe Pain    5th toe right- HT x years, developing a corn, rubbing shoes, 4th toe curling some now, tried padding   New Patient (Initial Visit)    73 y.o. female presents with the above complaint.   ROS: Denies fever chills nausea vomiting muscle aches pains calf pain back pain chest pain shortness of breath.  She states that this fifth toe has been bothering her for many years which is gotten worse over the past few years as it is getting bigger and is having more trouble with her shoe gear.  She states that she has tried wider shoes filing the callus all to no avail.    Past Medical History:  Diagnosis Date   Anxiety    Chest pain, non-cardiac    Depression    GERD (gastroesophageal reflux disease)    Hyperlipidemia 2004   Hyperlipidemia    Hypertension    Osteoporosis    Thyroid disease    Past Surgical History:  Procedure Laterality Date   ABDOMINAL HYSTERECTOMY     CESAREAN SECTION     X2   COLONOSCOPY     COLONOSCOPY WITH PROPOFOL N/A 07/02/2018   Procedure: COLONOSCOPY WITH PROPOFOL;  Surgeon: Christena Deem, MD;  Location: [facility] ENDOSCOPY;  Service: Endoscopy;  Laterality: N/A;   COLONOSCOPY WITH PROPOFOL N/A 09/03/2018   Procedure: COLONOSCOPY WITH PROPOFOL;  Surgeon: Christena Deem, MD;  Location: [facility] ENDOSCOPY;  Service: Endoscopy;  Laterality: N/A;   OOPHORECTOMY     TONSILLECTOMY     TUBAL LIGATION      Current Outpatient Medications:    aspirin EC 81 MG tablet, Take 81 mg by mouth daily. Swallow whole., Disp: , Rfl:    busPIRone (BUSPAR) 10 MG tablet, Take 1 tablet by mouth 2 (two) times daily., Disp: , Rfl:    levothyroxine (SYNTHROID) 75 MCG tablet, Take 75 mcg by mouth daily before breakfast., Disp: , Rfl:    meloxicam (MOBIC) 15 MG tablet, Take 1 tablet by mouth daily., Disp: , Rfl:    metoprolol tartrate  (LOPRESSOR) 100 MG tablet, Take 1 tablet (100 mg total) by mouth once for 1 dose. Take 90-120 minutes prior to scan. Hold for SBP less than 110., Disp: 1 tablet, Rfl: 0   topiramate (TOPAMAX) 50 MG tablet, Take 1 tablet by mouth at bedtime., Disp: , Rfl:    venlafaxine XR (EFFEXOR-XR) 75 MG 24 hr capsule, Take by mouth., Disp: , Rfl:    atorvastatin (LIPITOR) 20 MG tablet, Take 20 mg by mouth daily., Disp: , Rfl:    benazepril-hydrochlorthiazide (LOTENSIN HCT) 20-25 MG tablet, Take 1 tablet by mouth daily., Disp: , Rfl:    omeprazole (PRILOSEC) 20 MG capsule, Take 20 mg by mouth 2 (two) times daily., Disp: , Rfl:    phentermine (ADIPEX-P) 37.5 MG tablet, Take 37.5 mg by mouth every morning., Disp: , Rfl:   No Known Allergies Review of Systems Objective:  There were no vitals filed for this visit.  General: Well developed, nourished, in no acute distress, alert and oriented x3   Dermatological: Skin is warm, dry and supple bilateral. Nails x 10 are well maintained; remaining integument appears unremarkable at this time. There are no open sores, no preulcerative lesions, no rash or signs of infection present.  Vascular: Dorsalis Pedis artery and Posterior Tibial artery pedal pulses are  2/4 bilateral with immedate capillary fill time. Pedal hair growth present. No varicosities and no lower extremity edema present bilateral.   Neruologic: Grossly intact via light touch bilateral. Vibratory intact via tuning fork bilateral. Protective threshold with Semmes Wienstein monofilament intact to all pedal sites bilateral. Patellar and Achilles deep tendon reflexes 2+ bilateral. No Babinski or clonus noted bilateral.   Musculoskeletal: No gross boney pedal deformities bilateral. No pain, crepitus, or limitation noted with foot and ankle range of motion bilateral. Muscular strength 5/5 in all groups tested bilateral.  Rigid hammertoe deformity fifth digit right foot with hypertrophy of the bone secondary to  arthritis.  She also has a small reactive benign skin lesion medial distal aspect of the fifth digit right foot due to his juxtaposition of the fourth toe.  Gait: Unassisted, Nonantalgic.    Radiographs:  Radiographs taken today demonstrate an osseously mature individual with generalized demineralized bone.  She has significant osteoarthritic change to the toes particularly the fifth digit right foot with hypertrophy of the head of the proximal phalanx and a exostosis to the distal medial distal phalanx.  Assessment & Plan:   Assessment: Hammertoe deformity and exostosis fifth digit right  Plan: Discussed etiology pathology conservative versus surgical therapies at this point debrided benign skin lesion.  Consented her today for an arthroplasty derotational in nature fifth digit right foot with an exostectomy distal medial aspect.  We went over the possible complications associated with this she understands and is amenable to it we will do this in the near future.     Audrey Finley, [place]

## 2024-03-10 ENCOUNTER — Telehealth: Payer: Self-pay | Admitting: Podiatry

## 2024-03-10 NOTE — Telephone Encounter (Signed)
 Pt called back and is seeing her orthopedic doctor tomorrow for her knee and would like to fiqure out what is going on with the knee and then call me back to schedule her foot surgery.  I told her I have her paperwork waiting and when she is ready to give [place] a call and we will get her scheduled.

## 2024-03-10 NOTE — Telephone Encounter (Signed)
Left message for pt to call to schedule surgery

## 2024-03-11 DIAGNOSIS — M1711 Unilateral primary osteoarthritis, right knee: Secondary | ICD-10-CM | POA: Diagnosis not present

## 2024-03-20 NOTE — H&P (Signed)
 ORTHOPAEDIC HISTORY & PHYSICAL Audrey Finley, Kalvin Orf., MD - 03/11/2024 10:00 AM EDT Formatting of this note is different from the original. Images from the original note were not included. Chief Complaint: Chief Complaint Patient presents with Right knee degenerative arthrosis  Reason for Visit: The patient is a 73 y.o. female who presents today for reevaluation of her right knee. She reports a 7 month history of right knee pain. She was apparently getting out of a car and the knee "buckled". She had significant pain to the knee and was initially evaluated at [facility]. MRI report from [facility] dated 09/04/2023 demonstrated tricompartment arthropathic changes and chondromalacia especially evident within the patellofemoral joint. A macerated tear of the posterior medial meniscus root was noted.  She localizes most of the pain along the medial aspect of the knee. She reports some swelling, no locking, and some giving way of the knee. The pain is aggravated by any weight bearing. The knee pain limits the patient's ability to ambulate long distances. The patient has not appreciated any significant improvement despite intraarticular corticosteroid injections, NSAIDs, viscosupplementation, and activity modification. She is not using any ambulatory aids. The patient states that the knee pain has progressed to the point that it is significantly interfering with her activities of daily living.  Medications: Current Outpatient Medications Medication Sig Dispense Refill ascorbic acid, vitamin C, (VITAMIN C) 500 MG tablet Take 1,000 mg by mouth once daily aspirin  81 MG EC tablet Take 81 mg by mouth once daily atorvastatin  (LIPITOR) 20 MG tablet Take 1 tablet by mouth once daily 90 tablet 0 benazepril -hydrochlorthiazide (LOTENSIN  HCT) 20-25 mg tablet Take 1 tablet by mouth once daily 30 tablet 11 busPIRone  (BUSPAR ) 10 MG tablet Take 1 tablet by mouth twice daily 180 tablet 1 cyanocobalamin , vitamin  B-12, 1,500 mcg Chew Take 3,000 mcg by mouth once daily docusate (COLACE) 100 MG capsule Take 100 mg by mouth 2 (two) times daily levothyroxine  (SYNTHROID ) 75 MCG tablet TAKE 1 TABLET BY MOUTH ONCE DAILY TAKE ON AN EMPTY STOMACH WITH A GLASS OF WATER AT LEAST 30-60 MINUTES BEFORE BREAKFAST 100 tablet 1 MAGNESIUM  ORAL Take 1,200 mg by mouth once daily meloxicam  (MOBIC ) 15 MG tablet Take 1 tablet by mouth once daily 90 tablet 0 omeprazole (PRILOSEC) 20 MG DR capsule Take 1 capsule (20 mg total) by mouth 2 (two) times daily before meals 180 capsule 3 phentermine  (ADIPEX-P ) 37.5 mg tablet Take 1 tablet (37.5 mg total) by mouth every morning before breakfast 90 tablet 0 topiramate  (TOPAMAX ) 50 MG tablet TAKE 1 TABLET BY MOUTH AT BEDTIME 30 tablet 0 venlafaxine  (EFFEXOR -XR) 75 MG XR capsule Take 1 capsule (75 mg total) by mouth once daily vitamin E 400 UNIT capsule Take 400 Units by mouth once daily  No current facility-administered medications for this visit.  Allergies: No Known Allergies  Past Medical History: Past Medical History: Diagnosis Date Anxiety Arthritis Coronary artery calcification 06/17/2023 Depression GERD (gastroesophageal reflux disease) Hiatal hernia Hyperlipidemia Hypertension Non-cardiac chest pain with normal coronary anatomy by cardiac catheterization in May of 2012. Osteoporosis, post-menopausal Thyroid  disease Hashimoto's thyroiditis with subsequent hypothyroidism  Past Surgical History: Past Surgical History: Procedure Laterality Date TUBAL LIGATION 06/25/1979 COLONOSCOPY 02/14/2005 Dr. Rolena Click. [place] @ [facility] - Diverticulosis COLONOSCOPY 09/03/2018 Diverticulosis/Otherwise normal/Repeat 13yrs/MUS EGD @ [facility] 02/11/2024 Gastritis/EGD path unremarkable. Repeat EGD prn. Consider ph impedance and manometry/CTL CESAREAN SECTION 05/08/77. 06/25/79 HYSTERECTOMY TAH/BSO TONSILLECTOMY  Social History: Social History  Socioeconomic History Marital status:  Married Tobacco Use Smoking status: Never Smokeless tobacco: Never  Vaping Use Vaping status: Never Used Substance and Sexual Activity Alcohol use: Yes Comment: Socially Drug use: Never Sexual activity: Yes Partners: Male Birth control/protection: None Comment: Hysterectomy  Social Drivers of Health  Financial Resource Strain: Low Risk (12/07/2023) Overall Financial Resource Strain (CARDIA) Difficulty of Paying Living Expenses: Not hard at all Food Insecurity: No Food Insecurity (12/07/2023) Hunger Vital Sign Worried About Running Out of Food in the Last Year: Never true Ran Out of Food in the Last Year: Never true Transportation Needs: No Transportation Needs (12/07/2023) PRAPARE - Transportation Lack of Transportation (Medical): No Lack of Transportation (Non-Medical): No Housing Stability: Low Risk (03/11/2024) Housing Stability Vital Sign Unable to Pay for Housing in the Last Year: No Number of Times Moved in the Last Year: 0 Homeless in the Last Year: No  Family History: Family History Problem Relation Name Age of Onset Coronary Artery Disease (Blocked arteries around heart) Mother Dorothy Diabetes type II Mother Perla Bradford Glaucoma Mother Perla Bradford Osteoporosis (Thinning of bones) Mother Perla Bradford Stroke Mother Dorothy Myocardial Infarction (Heart attack) Mother Dorothy High blood pressure (Hypertension) Father Drexel Gentles Coronary Artery Disease (Blocked arteries around heart) Father Drexel Gentles Hyperlipidemia (Elevated cholesterol) Father Drexel Gentles Myocardial Infarction (Heart attack) Father Drexel Gentles High blood pressure (Hypertension) Sister Lovette Rud Hyperlipidemia (Elevated cholesterol) Sister Lovette Rud Dementia Paternal Aunt  Review of Systems: A comprehensive 14 point ROS was performed, reviewed, and the pertinent orthopaedic findings are documented in the HPI.  Exam BP 126/82  Ht 167.6 cm (5\' 6" )  Wt 76.3 kg (168 lb 3.2 oz)  BMI 27.15  kg/m  General: Well-developed, well-nourished female seen in no acute distress. Antalgic gait. Varus thrust to the right knee.  HEENT: Atraumatic, normocephalic. Pupils are equal and reactive to light. Extraocular motion is intact. Sclera are clear. Oropharynx is clear with moist mucosa.  Neck: Supple, nontender, and with good ROM. No thyromegaly, adenopathy, JVD, or carotid bruits.  Lungs: Clear to auscultation bilaterally.  Cardiovascular: Regular rate and rhythm. Normal S1, S2. No murmur . No appreciable gallops or rubs. Peripheral pulses are palpable. No lower extremity edema. Homan`s test is negative.  Abdomen: Soft, nontender, nondistended. Bowel sounds are present.  Extremities: Good strength, stability, and range of motion of the upper extremities. Good range of motion of the hips and ankles.  Right Knee: Soft tissue swelling: mild Effusion: none Erythema: none Crepitance: minimal Tenderness: medial Alignment: relative varus Mediolateral laxity: medial pseudolaxity Posterior sag: negative Patellar tracking: Good tracking without evidence of subluxation or tilt Atrophy: No significant atrophy. Quadriceps tone was fair to good. Range of motion: 0/0/122 degrees  Neurologic: Awake, alert, and oriented. Sensory function is intact to pinprick and light touch. Motor strength is judged to be 5/5. Motor coordination is within normal limits. No apparent clonus. No tremor.  X-rays: I reviewed the right knee radiographs that were performed at [facility] on [facility]/19/2024. There is narrowing of the medial cartilage space with associated varus alignment. There is no significant osteophyte formation or subchondral sclerosis. No evidence of fracture or dislocation.  Impression: Degenerative arthrosis of the right knee  Plan: The findings were discussed in detail with the patient. The patient was given informational material on total knee replacement. Conservative  treatment options were reviewed with the patient. We discussed the risks and benefits of surgical intervention. The usual perioperative course was also discussed in detail. The patient expressed understanding of the risks and benefits of surgical intervention and would like to proceed with plans for right total knee arthroplasty.  I spent  a total of 45 minutes in both face-to-face and non-face-to-face activities, excluding procedures performed, for this visit on the date of this encounter.  MEDICAL CLEARANCE: Per anesthesiology. ACTIVITY: As tolerated. WORK STATUS: Not applicable. THERAPY: Preoperative physical therapy evaluation. MEDICATIONS: Requested Prescriptions  No prescriptions requested or ordered in this encounter  FOLLOW-UP: Return for preop History & Physical pending surgery date.  Dilia Alemany P. Oktober Glazer, Jr., M.D.  This note was generated in part with voice recognition software and I apologize for any typographical errors that were not detected and corrected.  Electronically signed by Marilou Showman., MD at 03/13/2024 8:01 AM EDT

## 2024-03-20 NOTE — Discharge Instructions (Signed)
 Instructions after Total Knee Replacement   Audrey Finley P. Angie Fava., M.D.    Dept. of Orthopaedics & Sports Medicine [facility] [address] [place], [place]  [address]  Phone: [phone]   Fax: [phone]       [email or web address]       DIET: Drink plenty of non-alcoholic fluids. Resume your normal diet. Include foods high in fiber.  ACTIVITY:  You may use crutches or a walker with weight-bearing as tolerated, unless instructed otherwise. You may be weaned off of the walker or crutches by your Physical Therapist.  Do NOT place pillows under the knee. Anything placed under the knee could limit your ability to straighten the knee.   Use the Bone Foam 3 times a day for 30 minutes each session to help straighten the knee. Continue doing gentle exercises. Exercising will reduce the pain and swelling, increase motion, and prevent muscle weakness.   Please continue to use the TED compression stockings for 6 weeks. You may remove the stockings at night, but should reapply them in the morning. Do not drive or operate any equipment until instructed.  WOUND CARE:  The initial dressing (Aquacel) can remain in place for 7 days (see separate instructions). Continue to use the PolarCare or ice packs periodically to reduce pain and swelling. You may bathe or shower after the staples are removed at the first office visit following surgery.  MEDICATIONS: You may resume your regular medications. Please take the pain medication as prescribed on the medication. Do not take pain medication on an empty stomach. Unless instructed otherwise, you should take an enteric-coated aspirin 81 mg. TWICE a day. (This along with elevation will help reduce the possibility of blood clots/phlebitis in your operated leg.) Use a stool softener (such as Senokot-S or Colace) daily and a laxative (such as Miralax or Dulcolax) as needed to prevent constipation.  Do not drive or drink alcoholic beverages when  taking pain medications.  CALL THE OFFICE FOR: Temperature above 101 degrees Excessive bleeding or drainage on the dressing. Excessive swelling, coldness, or paleness of the toes. Persistent nausea and vomiting.  FOLLOW-UP:  You should have an appointment to return to the office in 10-14 days after surgery. Arrangements have been made for continuation of Physical Therapy (either home therapy or outpatient therapy).     [facility] Department Directory         [email or web address]       [email or web address]          Cardiology  Appointments: [place] - [phone]  Endocrinology  Appointments: [place] [phone] [place] - [phone]  Gastroenterology  Appointments: [employer] [phone] [place] - [phone]        General Surgery   Appointments: [facility]  Internal Medicine/Family Medicine  Appointments: [facility] [place] - [phone] [place] - [phone]  Metabolic and Weigh Loss Surgery  Appointments: [facility]        Neurology  Appointments: [place] [phone] [place] - [phone]  Neurosurgery  Appointments: [place]  Obstetrics & Gynecology  Appointments: [place] [phone] [place] - [phone]        Pediatrics  Appointments: [place] [phone] [place] - [phone]  Physiatry  Appointments: [place] [phone]  Physical Therapy  Appointments: [place] - [phone]        Podiatry  Appointments: [place] [phone] [place] - [phone]  Pulmonology  Appointments: [facility]  Rheumatology  Appointments: [place] [phone]  [place] Location: [facility]  [address] [place], [place]  [address]  [place] Location: [facility]. [address] [place], [place]  [address]  [place]  Location: [facility] [address] [place], [place]  [address]

## 2024-03-23 ENCOUNTER — Other Ambulatory Visit: Payer: Self-pay

## 2024-03-23 ENCOUNTER — Encounter
Admission: RE | Admit: 2024-03-23 | Discharge: 2024-03-23 | Disposition: A | Discharge status: Home / Self Care | Source: Ambulatory Visit | Attending: Orthopedic Surgery | Admitting: Orthopedic Surgery

## 2024-03-23 DIAGNOSIS — Z01818 Encounter for other preprocedural examination: Secondary | ICD-10-CM | POA: Insufficient documentation

## 2024-03-23 DIAGNOSIS — M1711 Unilateral primary osteoarthritis, right knee: Secondary | ICD-10-CM | POA: Insufficient documentation

## 2024-03-23 DIAGNOSIS — E785 Hyperlipidemia, unspecified: Secondary | ICD-10-CM | POA: Insufficient documentation

## 2024-03-23 DIAGNOSIS — Z01812 Encounter for preprocedural laboratory examination: Secondary | ICD-10-CM

## 2024-03-23 DIAGNOSIS — Z01810 Encounter for preprocedural cardiovascular examination: Secondary | ICD-10-CM | POA: Diagnosis not present

## 2024-03-23 HISTORY — DX: Diverticulosis of intestine, part unspecified, without perforation or abscess without bleeding: K57.90

## 2024-03-23 HISTORY — DX: Personal history of other diseases of the digestive system: Z87.19

## 2024-03-23 HISTORY — DX: Atherosclerotic heart disease of native coronary artery without angina pectoris: I25.10

## 2024-03-23 HISTORY — DX: Unilateral primary osteoarthritis, right knee: M17.11

## 2024-03-23 HISTORY — DX: Hypothyroidism, unspecified: E03.9

## 2024-03-23 HISTORY — DX: Vitamin D deficiency, unspecified: E55.9

## 2024-03-23 HISTORY — DX: Autoimmune thyroiditis: E06.3

## 2024-03-23 HISTORY — DX: Essential (primary) hypertension: I10

## 2024-03-23 HISTORY — DX: Deficiency of other specified B group vitamins: E53.8

## 2024-03-23 HISTORY — DX: Thrombocytopenia, unspecified: D69.6

## 2024-03-23 HISTORY — DX: Other forms of dyspnea: R06.09

## 2024-03-23 LAB — COMPREHENSIVE METABOLIC PANEL WITH GFR
ALT: 16 U/L (ref 0–44)
AST: 19 U/L (ref 15–41)
Albumin: 4.3 g/dL (ref 3.5–5.0)
Alkaline Phosphatase: 44 U/L (ref 38–126)
Anion gap: 8 (ref 5–15)
BUN: 31 mg/dL — ABNORMAL HIGH (ref 8–23)
CO2: 27 mmol/L (ref 22–32)
Calcium: 10.2 mg/dL (ref 8.9–10.3)
Chloride: 103 mmol/L (ref 98–111)
Creatinine, Ser: 0.88 mg/dL (ref 0.44–1.00)
GFR, Estimated: >60 mL/min (ref >60)
Glucose, Bld: 87 mg/dL (ref 70–99)
Potassium: 3.2 mmol/L — ABNORMAL LOW (ref 3.5–5.1)
Sodium: 138 mmol/L (ref 135–145)
Total Bilirubin: 1.2 mg/dL (ref 0.0–1.2)
Total Protein: 7.6 g/dL (ref 6.5–8.1)

## 2024-03-23 LAB — CBC
HCT: 40 % (ref 36.0–46.0)
Hemoglobin: 13.1 g/dL (ref 12.0–15.0)
MCH: 31.3 pg (ref 26.0–34.0)
MCHC: 32.8 g/dL (ref 30.0–36.0)
MCV: 95.7 fL (ref 80.0–100.0)
Platelets: 148 10*3/uL — ABNORMAL LOW (ref 150–400)
RBC: 4.18 MIL/uL (ref 3.87–5.11)
RDW: 14 % (ref 11.5–15.5)
WBC: 4.7 10*3/uL (ref 4.0–10.5)
nRBC: 0 % (ref 0.0–0.2)

## 2024-03-23 LAB — URINALYSIS, ROUTINE W REFLEX MICROSCOPIC
Bilirubin Urine: NEGATIVE
Glucose, UA: NEGATIVE mg/dL
Hgb urine dipstick: NEGATIVE
Ketones, ur: NEGATIVE mg/dL
Leukocytes,Ua: NEGATIVE
Nitrite: NEGATIVE
Protein, ur: NEGATIVE mg/dL
Specific Gravity, Urine: 1.028 (ref 1.005–1.030)
pH: 5 (ref 5.0–8.0)

## 2024-03-23 LAB — SEDIMENTATION RATE: Sed Rate: 21 mm/h (ref 0–30)

## 2024-03-23 LAB — C-REACTIVE PROTEIN: CRP: <0.5 mg/dL (ref <1.0)

## 2024-03-23 LAB — SURGICAL PCR SCREEN
MRSA, PCR: NEGATIVE
Staphylococcus aureus: NEGATIVE

## 2024-03-23 NOTE — Patient Instructions (Addendum)
 Your procedure is scheduled on: Monday, April 28 Report to the Registration Desk on the 1st floor of the [facility] To find out your arrival time, please call [phone] between 1PM - 3PM on: Friday, April 25 If your arrival time is 6:00 am, do not arrive before that time as the [facility] entrance doors do not open until 6:00 am.  REMEMBER: Instructions that are not followed completely may result in serious medical risk, up to and including death; or upon the discretion of your surgeon and anesthesiologist your surgery may need to be rescheduled.  Do not eat food after midnight the night before surgery.  No gum chewing or hard candies.  You may however, drink CLEAR liquids up to 2 hours before you are scheduled to arrive for your surgery. Do not drink anything within 2 hours of your scheduled arrival time.  Clear liquids include: - water  - apple juice without pulp - gatorade (not RED colors) - black coffee or tea (Do NOT add milk or creamers to the coffee or tea) Do NOT drink anything that is not on this list.  In addition, your doctor has ordered for you to drink the provided:  Ensure Pre-Surgery Clear Carbohydrate Drink  Drinking this carbohydrate drink up to two hours before surgery helps to reduce insulin resistance and improve patient outcomes. Please complete drinking 2 hours before scheduled arrival time.  One week prior to surgery:  Stop meloxicam and Anti-inflammatories (NSAIDS) such as Advil, Aleve, Ibuprofen, Motrin, Naproxen, Naprosyn and Aspirin based products such as Excedrin, Goody's Powder, BC Powder. Stop ANY OVER THE COUNTER supplements until after surgery. Stop vitamin C, multi-vitamins, vitamin E.  Phentermine - hold 7 days before surgery.  You may however, continue to take Tylenol if needed for pain up until the day of surgery.  Continue taking all of your other prescription medications up until the day of surgery.  ON THE DAY OF SURGERY ONLY TAKE  THESE MEDICATIONS WITH SIPS OF WATER:  atorvastatin (LIPITOR)  busPIRone (BUSPAR)  levothyroxine (SYNTHROID)  omeprazole (PRILOSEC)  venlafaxine XR (EFFEXOR-XR)   No Alcohol for 24 hours before or after surgery.  No Smoking including e-cigarettes for 24 hours before surgery.  No chewable tobacco products for at least 6 hours before surgery.  No nicotine patches on the day of surgery.  Do not use any "recreational" drugs for at least a week (preferably 2 weeks) before your surgery.  Please be advised that the combination of cocaine and anesthesia may have negative outcomes, up to and including death. If you test positive for cocaine, your surgery will be cancelled.  On the morning of surgery brush your teeth with toothpaste and water, you may rinse your mouth with mouthwash if you wish. Do not swallow any toothpaste or mouthwash.  Use CHG Soap as directed on instruction sheet.  Do not wear jewelry, make-up, hairpins, clips or nail polish.  For welded (permanent) jewelry: bracelets, anklets, waist bands, etc.  Please have this removed prior to surgery.  If it is not removed, there is a chance that hospital personnel will need to cut it off on the day of surgery.  Do not wear lotions, powders, or perfumes.   Do not shave body hair from the neck down 48 hours before surgery.  Contact lenses, hearing aids and dentures may not be worn into surgery.  Do not bring valuables to the hospital. [facility] is not responsible for any missing/lost belongings or valuables.   Notify your  doctor if there is any change in your medical condition (cold, fever, infection).  Wear comfortable clothing (specific to your surgery type) to the hospital.  After surgery, you can help prevent lung complications by doing breathing exercises.  Take deep breaths and cough every 1-2 hours. Your doctor may order a device called an Incentive Spirometer to help you take deep breaths.  If you are being admitted  to the hospital overnight, leave your suitcase in the car. After surgery it may be brought to your room.  In case of increased patient census, it may be necessary for you, the patient, to continue your postoperative care in the Same Day Surgery department.  If you are being discharged the day of surgery, you will not be allowed to drive home. You will need a responsible individual to drive you home and stay with you for 24 hours after surgery.   If you are taking public transportation, you will need to have a responsible individual with you.  Please call the Pre-admissions Testing Dept. at [phone] if you have any questions about these instructions.  Surgery Visitation Policy:  Patients having surgery or a procedure may have two visitors.  Children under the age of 29 must have an adult with them who is not the patient.  Inpatient Visitation:    Visiting hours are 7 a.m. to 8 p.m. Up to four visitors are allowed at one time in a patient room. The visitors may rotate out with other people during the day.  One visitor age 69 or older may stay with the patient overnight and must be in the room by 8 p.m.      Pre-operative 5 CHG Bath Instructions   You can play a key role in reducing the risk of infection after surgery. Your skin needs to be as free of germs as possible. You can reduce the number of germs on your skin by washing with CHG (chlorhexidine gluconate) soap before surgery. CHG is an antiseptic soap that kills germs and continues to kill germs even after washing.   DO NOT use if you have an allergy to chlorhexidine/CHG or antibacterial soaps. If your skin becomes reddened or irritated, stop using the CHG and notify one of our RNs at [phone].   Please shower with the CHG soap starting 4 days before surgery using the following schedule:     Please keep in mind the following:  DO NOT shave, including legs and underarms, starting the day of your first shower.   You  may shave your face at any point before/day of surgery.  Place clean sheets on your bed the day you start using CHG soap. Use a clean washcloth (not used since being washed) for each shower. DO NOT sleep with pets once you start using the CHG.   CHG Shower Instructions:  If you choose to wash your hair and private area, wash first with your normal shampoo/soap.  After you use shampoo/soap, rinse your hair and body thoroughly to remove shampoo/soap residue.  Turn the water OFF and apply about 3 tablespoons (45 ml) of CHG soap to a CLEAN washcloth.  Apply CHG soap ONLY FROM YOUR NECK DOWN TO YOUR TOES (washing for 3-5 minutes)  DO NOT use CHG soap on face, private areas, open wounds, or sores.  Pay special attention to the area where your surgery is being performed.  If you are having back surgery, having someone wash your back for you may be helpful. Wait 2 minutes after  CHG soap is applied, then you may rinse off the CHG soap.  Pat dry with a clean towel  Put on clean clothes/pajamas   If you choose to wear lotion, please use ONLY the CHG-compatible lotions on the back of this paper.     Additional instructions for the day of surgery: DO NOT APPLY any lotions, deodorants, cologne, or perfumes.   Put on clean/comfortable clothes.  Brush your teeth.  Ask your nurse before applying any prescription medications to the skin.      CHG Compatible Lotions   Aveeno Moisturizing lotion  Cetaphil Moisturizing Cream  Cetaphil Moisturizing Lotion  Clairol Herbal Essence Moisturizing Lotion, Dry Skin  Clairol Herbal Essence Moisturizing Lotion, Extra Dry Skin  Clairol Herbal Essence Moisturizing Lotion, Normal Skin  Curel Age Defying Therapeutic Moisturizing Lotion with Alpha Hydroxy  Curel Extreme Care Body Lotion  Curel Soothing Hands Moisturizing Hand Lotion  Curel Therapeutic Moisturizing Cream, Fragrance-Free  Curel Therapeutic Moisturizing Lotion, Fragrance-Free  Curel Therapeutic  Moisturizing Lotion, Original Formula  Eucerin Daily Replenishing Lotion  Eucerin Dry Skin Therapy Plus Alpha Hydroxy Crme  Eucerin Dry Skin Therapy Plus Alpha Hydroxy Lotion  Eucerin Original Crme  Eucerin Original Lotion  Eucerin Plus Crme Eucerin Plus Lotion  Eucerin TriLipid Replenishing Lotion  Keri Anti-Bacterial Hand Lotion  Keri Deep Conditioning Original Lotion Dry Skin Formula Softly Scented  Keri Deep Conditioning Original Lotion, Fragrance Free Sensitive Skin Formula  Keri Lotion Fast Absorbing Fragrance Free Sensitive Skin Formula  Keri Lotion Fast Absorbing Softly Scented Dry Skin Formula  Keri Original Lotion  Keri Skin Renewal Lotion Keri Silky Smooth Lotion  Keri Silky Smooth Sensitive Skin Lotion  Nivea Body Creamy Conditioning Oil  Nivea Body Extra Enriched Lotion  Nivea Body Original Lotion  Nivea Body Sheer Moisturizing Lotion Nivea Crme  Nivea Skin Firming Lotion  NutraDerm 30 Skin Lotion  NutraDerm Skin Lotion  NutraDerm Therapeutic Skin Cream  NutraDerm Therapeutic Skin Lotion  ProShield Protective Hand Cream  Provon moisturizing lotion   Preoperative Educational Videos for Total Hip, Knee and Shoulder Replacements  To better prepare for surgery, please view our videos that explain the physical activity and discharge planning required to have the best surgical recovery at [facility]  [email or web address]  Questions? Call [phone] or email [email or web address]

## 2024-03-27 ENCOUNTER — Encounter: Payer: Self-pay | Admitting: Orthopedic Surgery

## 2024-03-27 MED ORDER — GABAPENTIN 300 MG PO CAPS
300.0000 mg | ORAL_CAPSULE | Freq: Once | ORAL | Status: AC
  Administered 2024-03-28: 300 mg via ORAL

## 2024-03-27 MED ORDER — CHLORHEXIDINE GLUCONATE 0.12 % MT SOLN
15.0000 mL | Freq: Once | OROMUCOSAL | Status: AC
  Administered 2024-03-28: 15 mL via OROMUCOSAL

## 2024-03-27 MED ORDER — CHLORHEXIDINE GLUCONATE 4 % EX SOLN: 60.0000 mL | Freq: Once | CUTANEOUS | Status: DC

## 2024-03-27 MED ORDER — CELECOXIB 200 MG PO CAPS
400.0000 mg | ORAL_CAPSULE | Freq: Once | ORAL | Status: AC
  Administered 2024-03-28: 400 mg via ORAL

## 2024-03-27 MED ORDER — TRANEXAMIC ACID-NACL 1000-0.7 MG/100ML-% IV SOLN
1000.0000 mg | INTRAVENOUS | Status: AC
  Administered 2024-03-28: 1000 mg via INTRAVENOUS

## 2024-03-27 MED ORDER — CEFAZOLIN SODIUM-DEXTROSE 2-4 GM/100ML-% IV SOLN
2.0000 g | INTRAVENOUS | Status: AC
  Administered 2024-03-28: 2 g via INTRAVENOUS

## 2024-03-27 MED ORDER — DEXAMETHASONE SODIUM PHOSPHATE 10 MG/ML IJ SOLN
8.0000 mg | Freq: Once | INTRAMUSCULAR | Status: AC
  Administered 2024-03-28: 8 mg via INTRAVENOUS

## 2024-03-27 MED ORDER — LACTATED RINGERS IV SOLN: INTRAVENOUS | Status: DC

## 2024-03-27 MED ORDER — ORAL CARE MOUTH RINSE: 15.0000 mL | Freq: Once | OROMUCOSAL | Status: AC

## 2024-03-28 ENCOUNTER — Observation Stay
Admission: RE | Admit: 2024-03-28 | Discharge: 2024-03-29 | Disposition: A | Discharge status: Home / Self Care | Attending: Orthopedic Surgery | Admitting: Orthopedic Surgery

## 2024-03-28 ENCOUNTER — Ambulatory Visit: Payer: Self-pay | Admitting: Urgent Care

## 2024-03-28 ENCOUNTER — Encounter
Admission: RE | Disposition: A | Discharge status: Home / Self Care | Payer: Self-pay | Source: Home / Self Care | Attending: Orthopedic Surgery

## 2024-03-28 ENCOUNTER — Other Ambulatory Visit: Payer: Self-pay

## 2024-03-28 ENCOUNTER — Observation Stay

## 2024-03-28 ENCOUNTER — Encounter: Payer: Self-pay | Admitting: Orthopedic Surgery

## 2024-03-28 DIAGNOSIS — E039 Hypothyroidism, unspecified: Secondary | ICD-10-CM | POA: Insufficient documentation

## 2024-03-28 DIAGNOSIS — Z01812 Encounter for preprocedural laboratory examination: Secondary | ICD-10-CM

## 2024-03-28 DIAGNOSIS — Z7982 Long term (current) use of aspirin: Secondary | ICD-10-CM | POA: Insufficient documentation

## 2024-03-28 DIAGNOSIS — E785 Hyperlipidemia, unspecified: Secondary | ICD-10-CM

## 2024-03-28 DIAGNOSIS — I10 Essential (primary) hypertension: Secondary | ICD-10-CM | POA: Insufficient documentation

## 2024-03-28 DIAGNOSIS — Z96651 Presence of right artificial knee joint: Secondary | ICD-10-CM

## 2024-03-28 DIAGNOSIS — M1711 Unilateral primary osteoarthritis, right knee: Principal | ICD-10-CM | POA: Insufficient documentation

## 2024-03-28 DIAGNOSIS — Z79899 Other long term (current) drug therapy: Secondary | ICD-10-CM | POA: Diagnosis not present

## 2024-03-28 HISTORY — PX: KNEE ARTHROPLASTY: SHX992

## 2024-03-28 SURGERY — ARTHROPLASTY, KNEE, TOTAL, USING IMAGELESS COMPUTER-ASSISTED NAVIGATION
Anesthesia: Spinal | Site: Knee | Laterality: Right

## 2024-03-28 MED ORDER — FENTANYL CITRATE (PF) 100 MCG/2ML IJ SOLN
INTRAMUSCULAR | Status: AC
  Filled 2024-03-28: qty 2

## 2024-03-28 MED ORDER — LIDOCAINE HCL (PF) 2 % IJ SOLN
INTRAMUSCULAR | Status: AC
  Filled 2024-03-28: qty 5

## 2024-03-28 MED ORDER — PHENYLEPHRINE HCL-NACL 20-0.9 MG/250ML-% IV SOLN
INTRAVENOUS | Status: AC
  Filled 2024-03-28: qty 250

## 2024-03-28 MED ORDER — BENAZEPRIL-HYDROCHLOROTHIAZIDE 20-25 MG PO TABS: 1.0000 | ORAL_TABLET | Freq: Every day | ORAL | Status: DC

## 2024-03-28 MED ORDER — OXYCODONE HCL 5 MG PO TABS
5.0000 mg | ORAL_TABLET | ORAL | Status: DC | PRN
  Administered 2024-03-28 – 2024-03-29 (×2): 5 mg via ORAL
  Filled 2024-03-28 (×2): qty 1

## 2024-03-28 MED ORDER — BENAZEPRIL HCL 20 MG PO TABS
20.0000 mg | ORAL_TABLET | Freq: Every day | ORAL | Status: DC
  Filled 2024-03-28: qty 1

## 2024-03-28 MED ORDER — DIPHENHYDRAMINE HCL 12.5 MG/5ML PO ELIX: 12.5000 mg | ORAL_SOLUTION | ORAL | Status: DC | PRN

## 2024-03-28 MED ORDER — OXYCODONE HCL 5 MG/5ML PO SOLN: 5.0000 mg | Freq: Once | ORAL | Status: AC | PRN

## 2024-03-28 MED ORDER — SODIUM CHLORIDE 0.9 % IV SOLN
INTRAVENOUS | Status: DC | PRN
  Administered 2024-03-28: 60 mL

## 2024-03-28 MED ORDER — LEVOTHYROXINE SODIUM 25 MCG PO TABS
75.0000 ug | ORAL_TABLET | Freq: Every day | ORAL | Status: DC
  Administered 2024-03-29: 75 ug via ORAL
  Filled 2024-03-28: qty 1
  Filled 2024-03-28: qty 3

## 2024-03-28 MED ORDER — MIDAZOLAM HCL 2 MG/2ML IJ SOLN
INTRAMUSCULAR | Status: AC
  Filled 2024-03-28: qty 2

## 2024-03-28 MED ORDER — BUPIVACAINE HCL (PF) 0.5 % IJ SOLN
INTRAMUSCULAR | Status: DC | PRN
  Administered 2024-03-28: 3 mL

## 2024-03-28 MED ORDER — PANTOPRAZOLE SODIUM 40 MG PO TBEC
40.0000 mg | DELAYED_RELEASE_TABLET | Freq: Two times a day (BID) | ORAL | Status: DC
  Administered 2024-03-28 – 2024-03-29 (×2): 40 mg via ORAL
  Filled 2024-03-28 (×2): qty 1

## 2024-03-28 MED ORDER — POLYETHYLENE GLYCOL 3350 17 G PO PACK: 17.0000 g | PACK | Freq: Every day | ORAL | Status: DC

## 2024-03-28 MED ORDER — DEXMEDETOMIDINE HCL IN NACL 80 MCG/20ML IV SOLN
INTRAVENOUS | Status: AC
  Filled 2024-03-28: qty 20

## 2024-03-28 MED ORDER — ONDANSETRON HCL 4 MG/2ML IJ SOLN
INTRAMUSCULAR | Status: DC | PRN
  Administered 2024-03-28: 4 mg via INTRAVENOUS

## 2024-03-28 MED ORDER — ALUM & MAG HYDROXIDE-SIMETH 200-200-20 MG/5ML PO SUSP: 30.0000 mL | ORAL | Status: DC | PRN

## 2024-03-28 MED ORDER — TRANEXAMIC ACID-NACL 1000-0.7 MG/100ML-% IV SOLN
1000.0000 mg | Freq: Once | INTRAVENOUS | Status: AC
  Administered 2024-03-28: 1000 mg via INTRAVENOUS

## 2024-03-28 MED ORDER — PHENYLEPHRINE HCL-NACL 20-0.9 MG/250ML-% IV SOLN
INTRAVENOUS | Status: DC | PRN
  Administered 2024-03-28: 25 ug/min via INTRAVENOUS

## 2024-03-28 MED ORDER — TRAMADOL HCL 50 MG PO TABS: 50.0000 mg | ORAL_TABLET | ORAL | Status: DC | PRN

## 2024-03-28 MED ORDER — SODIUM CHLORIDE 0.9 % IV SOLN: INTRAVENOUS | Status: DC

## 2024-03-28 MED ORDER — FENTANYL CITRATE (PF) 100 MCG/2ML IJ SOLN
25.0000 ug | INTRAMUSCULAR | Status: DC | PRN
Start: 2024-03-28 — End: 2024-03-28
  Administered 2024-03-28 (×4): 50 ug via INTRAVENOUS

## 2024-03-28 MED ORDER — SODIUM CHLORIDE 0.9 % IR SOLN
Status: DC | PRN
  Administered 2024-03-28: 3000 mL

## 2024-03-28 MED ORDER — LIDOCAINE HCL (CARDIAC) PF 50 MG/5ML IV SOSY
PREFILLED_SYRINGE | INTRAVENOUS | Status: DC | PRN
Start: 2024-03-28 — End: 2024-03-28
  Administered 2024-03-28: 2 mL via INTRAVENOUS

## 2024-03-28 MED ORDER — MENTHOL 3 MG MT LOZG: 1.0000 | LOZENGE | OROMUCOSAL | Status: DC | PRN

## 2024-03-28 MED ORDER — DEXAMETHASONE SODIUM PHOSPHATE 10 MG/ML IJ SOLN
INTRAMUSCULAR | Status: AC
  Filled 2024-03-28: qty 1

## 2024-03-28 MED ORDER — OXYCODONE HCL 5 MG PO TABS: 10.0000 mg | ORAL_TABLET | ORAL | Status: DC | PRN

## 2024-03-28 MED ORDER — GABAPENTIN 300 MG PO CAPS
ORAL_CAPSULE | ORAL | Status: AC
  Filled 2024-03-28: qty 1

## 2024-03-28 MED ORDER — MIDAZOLAM HCL 5 MG/5ML IJ SOLN
INTRAMUSCULAR | Status: DC | PRN
  Administered 2024-03-28: 2 mg via INTRAVENOUS

## 2024-03-28 MED ORDER — PROPOFOL 1000 MG/100ML IV EMUL
INTRAVENOUS | Status: AC
  Filled 2024-03-28: qty 100

## 2024-03-28 MED ORDER — ONDANSETRON HCL 4 MG/2ML IJ SOLN
INTRAMUSCULAR | Status: AC
  Filled 2024-03-28: qty 2

## 2024-03-28 MED ORDER — SENNOSIDES-DOCUSATE SODIUM 8.6-50 MG PO TABS
1.0000 | ORAL_TABLET | Freq: Two times a day (BID) | ORAL | Status: DC
  Administered 2024-03-28 – 2024-03-29 (×2): 1 via ORAL
  Filled 2024-03-28 (×2): qty 1

## 2024-03-28 MED ORDER — FLEET ENEMA RE ENEM: 1.0000 | ENEMA | Freq: Once | RECTAL | Status: DC | PRN

## 2024-03-28 MED ORDER — ACETAMINOPHEN 325 MG PO TABS: 325.0000 mg | ORAL_TABLET | Freq: Four times a day (QID) | ORAL | Status: DC | PRN

## 2024-03-28 MED ORDER — VENLAFAXINE HCL ER 75 MG PO CP24
75.0000 mg | ORAL_CAPSULE | Freq: Every day | ORAL | Status: DC
  Administered 2024-03-29: 75 mg via ORAL
  Filled 2024-03-28: qty 1

## 2024-03-28 MED ORDER — CEFAZOLIN SODIUM-DEXTROSE 2-4 GM/100ML-% IV SOLN
2.0000 g | Freq: Four times a day (QID) | INTRAVENOUS | Status: AC
  Administered 2024-03-28 – 2024-03-29 (×2): 2 g via INTRAVENOUS
  Filled 2024-03-28 (×4): qty 100

## 2024-03-28 MED ORDER — TOPIRAMATE 25 MG PO TABS
50.0000 mg | ORAL_TABLET | Freq: Every day | ORAL | Status: DC
  Administered 2024-03-28: 50 mg via ORAL
  Filled 2024-03-28: qty 2

## 2024-03-28 MED ORDER — HYDROMORPHONE HCL 1 MG/ML IJ SOLN
INTRAMUSCULAR | Status: AC
  Filled 2024-03-28: qty 1

## 2024-03-28 MED ORDER — ENSURE PRE-SURGERY PO LIQD
296.0000 mL | Freq: Once | ORAL | Status: AC
  Administered 2024-03-28: 296 mL via ORAL
  Filled 2024-03-28: qty 296

## 2024-03-28 MED ORDER — HYDROMORPHONE HCL 1 MG/ML IJ SOLN
0.5000 mg | INTRAMUSCULAR | Status: DC | PRN
  Administered 2024-03-28 (×2): 0.5 mg via INTRAVENOUS

## 2024-03-28 MED ORDER — PHENTERMINE HCL 37.5 MG PO TABS: 37.5000 mg | ORAL_TABLET | Freq: Every morning | ORAL | Status: DC

## 2024-03-28 MED ORDER — ASPIRIN 81 MG PO CHEW
81.0000 mg | CHEWABLE_TABLET | Freq: Two times a day (BID) | ORAL | Status: DC
  Administered 2024-03-28 – 2024-03-29 (×2): 81 mg via ORAL
  Filled 2024-03-28 (×2): qty 1

## 2024-03-28 MED ORDER — ATORVASTATIN CALCIUM 10 MG PO TABS
20.0000 mg | ORAL_TABLET | Freq: Every day | ORAL | Status: DC
  Administered 2024-03-29: 20 mg via ORAL
  Filled 2024-03-28: qty 2

## 2024-03-28 MED ORDER — ONDANSETRON HCL 4 MG PO TABS: 4.0000 mg | ORAL_TABLET | Freq: Four times a day (QID) | ORAL | Status: DC | PRN

## 2024-03-28 MED ORDER — CELECOXIB 200 MG PO CAPS
200.0000 mg | ORAL_CAPSULE | Freq: Two times a day (BID) | ORAL | Status: DC
  Administered 2024-03-28 – 2024-03-29 (×2): 200 mg via ORAL
  Filled 2024-03-28 (×6): qty 1

## 2024-03-28 MED ORDER — ONDANSETRON HCL 4 MG/2ML IJ SOLN: 4.0000 mg | Freq: Four times a day (QID) | INTRAMUSCULAR | Status: DC | PRN

## 2024-03-28 MED ORDER — KETAMINE HCL 50 MG/5ML IJ SOSY
PREFILLED_SYRINGE | INTRAMUSCULAR | Status: AC
Start: 2024-03-28 — End: ?
  Filled 2024-03-28: qty 5

## 2024-03-28 MED ORDER — BUPIVACAINE HCL (PF) 0.25 % IJ SOLN
INTRAMUSCULAR | Status: DC | PRN
  Administered 2024-03-28: 60 mL

## 2024-03-28 MED ORDER — CELECOXIB 200 MG PO CAPS
ORAL_CAPSULE | ORAL | Status: AC
  Filled 2024-03-28: qty 2

## 2024-03-28 MED ORDER — TRANEXAMIC ACID-NACL 1000-0.7 MG/100ML-% IV SOLN
INTRAVENOUS | Status: AC
  Filled 2024-03-28: qty 100

## 2024-03-28 MED ORDER — BUPIVACAINE HCL (PF) 0.5 % IJ SOLN
INTRAMUSCULAR | Status: AC
  Filled 2024-03-28: qty 10

## 2024-03-28 MED ORDER — SURGIPHOR WOUND IRRIGATION SYSTEM - OPTIME: TOPICAL | Status: DC | PRN

## 2024-03-28 MED ORDER — FENTANYL CITRATE (PF) 100 MCG/2ML IJ SOLN
INTRAMUSCULAR | Status: AC
Start: 2024-03-28 — End: ?
  Filled 2024-03-28: qty 2

## 2024-03-28 MED ORDER — PHENOL 1.4 % MT LIQD: 1.0000 | OROMUCOSAL | Status: DC | PRN

## 2024-03-28 MED ORDER — FERROUS SULFATE 325 (65 FE) MG PO TABS
325.0000 mg | ORAL_TABLET | Freq: Two times a day (BID) | ORAL | Status: DC
  Administered 2024-03-29: 325 mg via ORAL
  Filled 2024-03-28: qty 1

## 2024-03-28 MED ORDER — METOCLOPRAMIDE HCL 10 MG PO TABS
10.0000 mg | ORAL_TABLET | Freq: Three times a day (TID) | ORAL | Status: DC
  Administered 2024-03-28 – 2024-03-29 (×2): 10 mg via ORAL
  Filled 2024-03-28 (×2): qty 1

## 2024-03-28 MED ORDER — CEFAZOLIN SODIUM-DEXTROSE 2-4 GM/100ML-% IV SOLN
INTRAVENOUS | Status: AC
  Filled 2024-03-28: qty 100

## 2024-03-28 MED ORDER — MAGNESIUM HYDROXIDE 400 MG/5ML PO SUSP
30.0000 mL | Freq: Every day | ORAL | Status: DC
  Administered 2024-03-29: 30 mL via ORAL
  Filled 2024-03-28: qty 30

## 2024-03-28 MED ORDER — BUSPIRONE HCL 10 MG PO TABS
10.0000 mg | ORAL_TABLET | Freq: Two times a day (BID) | ORAL | Status: DC
  Administered 2024-03-28 – 2024-03-29 (×2): 10 mg via ORAL
  Filled 2024-03-28 (×2): qty 1

## 2024-03-28 MED ORDER — ACETAMINOPHEN 10 MG/ML IV SOLN
1000.0000 mg | Freq: Four times a day (QID) | INTRAVENOUS | Status: DC
  Administered 2024-03-28 – 2024-03-29 (×3): 1000 mg via INTRAVENOUS
  Filled 2024-03-28 (×2): qty 100

## 2024-03-28 MED ORDER — KETAMINE HCL 50 MG/5ML IJ SOSY
PREFILLED_SYRINGE | INTRAMUSCULAR | Status: DC | PRN
  Administered 2024-03-28: 20 mg via INTRAVENOUS

## 2024-03-28 MED ORDER — HYDROMORPHONE HCL 1 MG/ML IJ SOLN: 0.5000 mg | INTRAMUSCULAR | Status: DC | PRN

## 2024-03-28 MED ORDER — DEXMEDETOMIDINE HCL IN NACL 80 MCG/20ML IV SOLN
INTRAVENOUS | Status: DC | PRN
  Administered 2024-03-28 (×2): 8 ug via INTRAVENOUS

## 2024-03-28 MED ORDER — ACETAMINOPHEN 10 MG/ML IV SOLN
INTRAVENOUS | Status: AC
  Filled 2024-03-28: qty 100

## 2024-03-28 MED ORDER — HYDROCHLOROTHIAZIDE 25 MG PO TABS
25.0000 mg | ORAL_TABLET | Freq: Every day | ORAL | Status: DC
  Filled 2024-03-28: qty 1

## 2024-03-28 MED ORDER — BISACODYL 10 MG RE SUPP: 10.0000 mg | Freq: Every day | RECTAL | Status: DC | PRN

## 2024-03-28 MED ORDER — OXYCODONE HCL 5 MG PO TABS
5.0000 mg | ORAL_TABLET | Freq: Once | ORAL | Status: AC | PRN
  Administered 2024-03-28: 5 mg via ORAL

## 2024-03-28 MED ORDER — PROPOFOL 500 MG/50ML IV EMUL
INTRAVENOUS | Status: DC | PRN
  Administered 2024-03-28 (×2): 125 ug/kg/min via INTRAVENOUS

## 2024-03-28 MED ORDER — CHLORHEXIDINE GLUCONATE 0.12 % MT SOLN
OROMUCOSAL | Status: AC
  Filled 2024-03-28: qty 15

## 2024-03-28 MED ORDER — PHENYLEPHRINE 80 MCG/ML (10ML) SYRINGE FOR IV PUSH (FOR BLOOD PRESSURE SUPPORT)
PREFILLED_SYRINGE | INTRAVENOUS | Status: DC | PRN
  Administered 2024-03-28: 80 ug via INTRAVENOUS

## 2024-03-28 MED ORDER — OXYCODONE HCL 5 MG PO TABS
ORAL_TABLET | ORAL | Status: AC
  Filled 2024-03-28: qty 1

## 2024-03-28 SURGICAL SUPPLY — 63 items
ATTUNE PSFEM RTSZ6 NARCEM KNEE (Femur) IMPLANT
ATTUNE PSRP INSR SZ6 5 KNEE (Insert) IMPLANT
BASE TIBIAL ROT PLAT SZ 5 KNEE (Knees) IMPLANT
BATTERY INSTRU NAVIGATION (MISCELLANEOUS) ×4 IMPLANT
BIT DRILL QUICK REL 1/8 2PK SL (BIT) ×1 IMPLANT
BLADE CLIPPER SURG (BLADE) IMPLANT
BLADE SAW 70X12.5 (BLADE) ×1 IMPLANT
BLADE SAW 90X13X1.19 OSCILLAT (BLADE) ×1 IMPLANT
BLADE SAW 90X25X1.19 OSCILLAT (BLADE) ×1 IMPLANT
BRUSH SCRUB EZ PLAIN DRY (MISCELLANEOUS) ×1 IMPLANT
CEMENT HV SMART SET (Cement) IMPLANT
COOLER POLAR GLACIER W/PUMP (MISCELLANEOUS) ×1 IMPLANT
CUFF TRNQT CYL 24X4X16.5-23 (TOURNIQUET CUFF) IMPLANT
CUFF TRNQT CYL 30X4X21-28X (TOURNIQUET CUFF) IMPLANT
DRAPE SHEET LG 3/4 BI-LAMINATE (DRAPES) ×1 IMPLANT
DRSG AQUACEL AG ADV 3.5X14 (GAUZE/BANDAGES/DRESSINGS) ×1 IMPLANT
DRSG MEPILEX SACRM 8.7X9.8 (GAUZE/BANDAGES/DRESSINGS) ×1 IMPLANT
DRSG TEGADERM 4X4.75 (GAUZE/BANDAGES/DRESSINGS) ×1 IMPLANT
DURAPREP 26ML APPLICATOR (WOUND CARE) ×2 IMPLANT
ELECT CAUTERY BLADE 6.4 (BLADE) ×1 IMPLANT
ELECTRODE REM PT RTRN 9FT ADLT (ELECTROSURGICAL) ×1 IMPLANT
EVACUATOR 1/8 PVC DRAIN (DRAIN) ×1 IMPLANT
EX-PIN ORTHOLOCK NAV 4X150 (PIN) ×2 IMPLANT
GAUZE XEROFORM 1X8 LF (GAUZE/BANDAGES/DRESSINGS) ×1 IMPLANT
GLOVE BIO SURGEON STRL SZ7.5 (GLOVE) ×6 IMPLANT
GLOVE BIOGEL PI IND STRL 8 (GLOVE) ×2 IMPLANT
GOWN STRL REUS W/ TWL LRG LVL3 (GOWN DISPOSABLE) ×1 IMPLANT
GOWN STRL REUS W/ TWL XL LVL3 (GOWN DISPOSABLE) ×1 IMPLANT
GOWN TOGA ZIPPER T7+ PEEL AWAY (MISCELLANEOUS) ×1 IMPLANT
HOLDER FOLEY CATH W/STRAP (MISCELLANEOUS) ×1 IMPLANT
HOOD PEEL AWAY T7 (MISCELLANEOUS) ×1 IMPLANT
KIT TURNOVER KIT A (KITS) ×1 IMPLANT
KNIFE SCULPS 14X20 (INSTRUMENTS) ×1 IMPLANT
MANIFOLD NEPTUNE II (INSTRUMENTS) ×2 IMPLANT
NDL SPNL 20GX3.5 QUINCKE YW (NEEDLE) ×2 IMPLANT
NEEDLE SPNL 20GX3.5 QUINCKE YW (NEEDLE) ×2 IMPLANT
PACK TOTAL KNEE (MISCELLANEOUS) ×1 IMPLANT
PAD ABD DERMACEA PRESS 5X9 (GAUZE/BANDAGES/DRESSINGS) ×2 IMPLANT
PAD ARMBOARD POSITIONER FOAM (MISCELLANEOUS) ×3 IMPLANT
PAD WRAPON POLAR KNEE (MISCELLANEOUS) ×1 IMPLANT
PATELLA MEDIAL ATTUN 35MM KNEE (Knees) IMPLANT
PENCIL SMOKE EVACUATOR COATED (MISCELLANEOUS) ×1 IMPLANT
PIN DRILL FIX HALF THREAD (BIT) ×2 IMPLANT
PIN FIXATION 1/8DIA X 3INL (PIN) ×1 IMPLANT
SOL .9 NS 3000ML IRR UROMATIC (IV SOLUTION) ×1 IMPLANT
SOLUTION IRRIG SURGIPHOR (IV SOLUTION) ×1 IMPLANT
SPONGE DRAIN TRACH 4X4 STRL 2S (GAUZE/BANDAGES/DRESSINGS) ×1 IMPLANT
SPONGE T-LAP 18X18 ~~LOC~~+RFID (SPONGE) IMPLANT
STAPLER SKIN PROX 35W (STAPLE) ×1 IMPLANT
STOCKINETTE IMPERV 14X48 (MISCELLANEOUS) ×1 IMPLANT
STOCKINETTE STRL BIAS CUT 8X4 (MISCELLANEOUS) ×1 IMPLANT
STRAP TIBIA SHORT (MISCELLANEOUS) ×1 IMPLANT
SUCTION TUBE FRAZIER 10FR DISP (SUCTIONS) ×1 IMPLANT
SUT VIC AB 0 CT1 36 (SUTURE) ×1 IMPLANT
SUT VIC AB 1 CT1 36 (SUTURE) ×2 IMPLANT
SUT VIC AB 2-0 CT2 27 (SUTURE) ×1 IMPLANT
SYR 30ML LL (SYRINGE) ×2 IMPLANT
TIP FAN IRRIG PULSAVAC PLUS (DISPOSABLE) ×1 IMPLANT
TOWEL OR 17X26 4PK STRL BLUE (TOWEL DISPOSABLE) ×1 IMPLANT
TOWER CARTRIDGE SMART MIX (DISPOSABLE) ×1 IMPLANT
TRAP FLUID SMOKE EVACUATOR (MISCELLANEOUS) ×1 IMPLANT
TRAY FOLEY MTR SLVR 16FR STAT (SET/KITS/TRAYS/PACK) ×1 IMPLANT
WATER STERILE IRR 1000ML POUR (IV SOLUTION) ×1 IMPLANT

## 2024-03-28 NOTE — Progress Notes (Signed)
 Patient is not able to walk the distance required to go the bathroom, or he/she is unable to safely negotiate stairs required to access the bathroom.  A 3in1 BSC will alleviate this problem   Amenda Duclos P. Angie Fava M.D.

## 2024-03-28 NOTE — Transfer of Care (Signed)
 Immediate Anesthesia Transfer of Care Note  Patient: Audrey Finley  Procedure(s) Performed: ARTHROPLASTY, KNEE, TOTAL, USING IMAGELESS COMPUTER-ASSISTED NAVIGATION (Right: Knee)  Patient Location: PACU  Anesthesia Type:Spinal  Level of Consciousness: drowsy and patient cooperative  Airway & Oxygen Therapy: Patient Spontanous Breathing and Patient connected to face mask oxygen  Post-op Assessment: Report given to RN and Post -op Vital signs reviewed and stable  Post vital signs: Reviewed and stable  Last Vitals:  Vitals Value Taken Time  BP 111/76 03/28/24 1546  Temp 36.7 C 03/28/24 1542  Pulse 76 03/28/24 1546  Resp 14 03/28/24 1546  SpO2 100 % 03/28/24 1546  Vitals shown include unfiled device data.  Last Pain:  Vitals:   03/28/24 1012  PainSc: 0-No pain         Complications: There were no known notable events for this encounter.

## 2024-03-28 NOTE — Anesthesia Procedure Notes (Signed)
 Spinal  Patient location during procedure: OR Start time: 03/28/2024 11:53 AM End time: 03/28/2024 11:55 AM Reason for block: surgical anesthesia Staffing Performed: other anesthesia staff  Other anesthesia staff: Morey Ar, RN Performed by: Morey Ar, RN Authorized by: Morna Arabian, MD   Preanesthetic Checklist Completed: patient identified, IV checked, site marked, risks and benefits discussed, surgical consent, monitors and equipment checked, pre-op evaluation and timeout performed Spinal Block Patient position: sitting Prep: Betadine Patient monitoring: heart rate, continuous pulse ox, blood pressure and cardiac monitor Approach: midline Location: L4-5 Injection technique: single-shot Needle Needle type: Introducer and Pencan  Needle length: 9 cm Assessment Events: CSF return Additional Notes CSF with introducer, CSF swirl with injection. Negative paresthesia. Negative blood return. Positive free-flowing CSF. Expiration date of kit checked and confirmed. Patient tolerated procedure well, without complications.

## 2024-03-28 NOTE — Plan of Care (Signed)

## 2024-03-28 NOTE — Op Note (Signed)
 OPERATIVE NOTE  DATE OF SURGERY:  03/28/2024  PATIENT NAME:  MASEN WINDELL   DOB: 1951-02-17  MRN: [id number]  PRE-OPERATIVE DIAGNOSIS: Degenerative arthrosis of the right knee, primary  POST-OPERATIVE DIAGNOSIS:  Same  PROCEDURE:  Right total knee arthroplasty using computer-assisted navigation  SURGEON:  Maxene Span. M.D.  ASSISTANT:  Benjiman Bras, PA-C (present and scrubbed throughout the case, critical for assistance with exposure, retraction, instrumentation, and closure)  ANESTHESIA: spinal  ESTIMATED BLOOD LOSS: 50 mL  FLUIDS REPLACED: 600 mL of crystalloid  TOURNIQUET TIME: 94 minutes  DRAINS: 2 medium Hemovac drains  SOFT TISSUE RELEASES: Anterior cruciate ligament, posterior cruciate ligament, deep medial collateral ligament, patellofemoral ligament  IMPLANTS UTILIZED: DePuy Attune size 6N posterior stabilized femoral component (cemented), size 5 rotating platform tibial component (cemented), 35 mm medialized dome patella (cemented), and a 5 mm stabilized rotating platform polyethylene insert.  INDICATIONS FOR SURGERY: TRISTON BLAKEY is a 73 y.o. year old female with a long history of progressive knee pain. X-rays demonstrated severe degenerative changes in tricompartmental fashion. The patient had not seen any significant improvement despite conservative nonsurgical intervention. After discussion of the risks and benefits of surgical intervention, the patient expressed understanding of the risks benefits and agree with plans for total knee arthroplasty.   The risks, benefits, and alternatives were discussed at length including but not limited to the risks of infection, bleeding, nerve injury, stiffness, blood clots, the need for revision surgery, cardiopulmonary complications, among others, and they were willing to proceed.  PROCEDURE IN DETAIL: The patient was brought into the operating room and, after adequate spinal anesthesia was achieved, a tourniquet was  placed on the patient's upper thigh. The patient's knee and leg were cleaned and prepped with alcohol and DuraPrep and draped in the usual sterile fashion. A "timeout" was performed as per usual protocol. The lower extremity was exsanguinated using an Esmarch, and the tourniquet was inflated to 300 mmHg. An anterior longitudinal incision was made followed by a standard mid vastus approach. The deep fibers of the medial collateral ligament were elevated in a subperiosteal fashion off of the medial flare of the tibia so as to maintain a continuous soft tissue sleeve. The patella was subluxed laterally and the patellofemoral ligament was incised. Inspection of the knee demonstrated severe degenerative changes with full-thickness loss of articular cartilage. Osteophytes were debrided using a rongeur. Anterior and posterior cruciate ligaments were excised. Two 4.0 mm Schanz pins were inserted in the femur and into the tibia for attachment of the array of trackers used for computer-assisted navigation. Hip center was identified using a circumduction technique. Distal landmarks were mapped using the computer. The distal femur and proximal tibia were mapped using the computer. The distal femoral cutting guide was positioned using computer-assisted navigation so as to achieve a 5 distal valgus cut. The femur was sized and it was felt that a size 6N femoral component was appropriate. A size 6 femoral cutting guide was positioned and the anterior cut was performed and verified using the computer. This was followed by completion of the posterior and chamfer cuts. Femoral cutting guide for the central box was then positioned in the center box cut was performed.  Attention was then directed to the proximal tibia. Medial and lateral menisci were excised. The extramedullary tibial cutting guide was positioned using computer-assisted navigation so as to achieve a 0 varus-valgus alignment and 3 posterior slope. The cut was  performed and verified using the computer. The proximal tibia  was sized and it was felt that a size 5 tibial tray was appropriate. Tibial and femoral trials were inserted followed by insertion of a 5 mm polyethylene insert.  The knee was felt to be tight both in flexion and extension.  The trial components were removed and the extramedullary tibial cutting guide was placed so as to resect an additional 2 mm of bone.  The cut was performed verified using computer.  Trial components were inserted followed by insertion of a 5 mm polyethylene trial.  This allowed for excellent mediolateral soft tissue balancing both in flexion and in full extension. Finally, the patella was cut and prepared so as to accommodate a 35 mm medialized dome patella. A patella trial was placed and the knee was placed through a range of motion with excellent patellar tracking appreciated. The femoral trial was removed after debridement of posterior osteophytes. The central post-hole for the tibial component was reamed followed by insertion of a keel punch. Tibial trials were then removed. Cut surfaces of bone were irrigated with copious amounts of normal saline using pulsatile lavage and then suctioned dry. Polymethylmethacrylate cement was prepared in the usual fashion using a vacuum mixer. Cement was applied to the cut surface of the proximal tibia as well as along the undersurface of a size 5 rotating platform tibial component. Tibial component was positioned and impacted into place. Excess cement was removed using Personal assistant. Cement was then applied to the cut surfaces of the femur as well as along the posterior flanges of the size 6N femoral component. The femoral component was positioned and impacted into place. Excess cement was removed using Personal assistant. A 5 mm polyethylene trial was inserted and the knee was brought into full extension with steady axial compression applied. Finally, cement was applied to the backside of a 35  mm medialized dome patella and the patellar component was positioned and patellar clamp applied. Excess cement was removed using Personal assistant. After adequate curing of the cement, the tourniquet was deflated after a total tourniquet time of 94 minutes. Hemostasis was achieved using electrocautery. The knee was irrigated with copious amounts of normal saline using pulsatile lavage followed by 450 ml of Surgiphor and then suctioned dry. 20 mL of 1.3% Exparel and 60 mL of 0.25% Marcaine in 40 mL of normal saline was injected along the posterior capsule, medial and lateral gutters, and along the arthrotomy site. A 5 mm stabilized rotating platform polyethylene insert was inserted and the knee was placed through a range of motion with excellent mediolateral soft tissue balancing appreciated and excellent patellar tracking noted. 2 medium drains were placed in the wound bed and brought out through separate stab incisions. The medial parapatellar portion of the incision was reapproximated using interrupted sutures of #1 Vicryl. Subcutaneous tissue was approximated in layers using first #0 Vicryl followed #2-0 Vicryl. The skin was approximated with skin staples. A sterile dressing was applied.  The patient tolerated the procedure well and was transported to the recovery room in stable condition.    Zackory Pudlo P. Jabe Jeanbaptiste, Jr., M.D.

## 2024-03-28 NOTE — Anesthesia Preprocedure Evaluation (Signed)
 Anesthesia Evaluation  Patient identified by MRN, date of birth, ID band Patient awake    Reviewed: Allergy & Precautions, NPO status , Patient's Chart, lab work & pertinent test results  History of Anesthesia Complications (+) POST - OP SPINAL HEADACHE and history of anesthetic complications  Airway Mallampati: III  TM Distance: <3 FB Neck ROM: full    Dental  (+) Chipped   Pulmonary neg pulmonary ROS, neg shortness of breath   Pulmonary exam normal        Cardiovascular Exercise Tolerance: Good hypertension, (-) angina + CAD  (-) DOE Normal cardiovascular exam     Neuro/Psych  PSYCHIATRIC DISORDERS       Neuromuscular disease    GI/Hepatic Neg liver ROS, hiatal hernia,GERD  Controlled,,  Endo/Other  Hypothyroidism    Renal/GU      Musculoskeletal   Abdominal   Peds  Hematology negative hematology ROS (+)   Anesthesia Other Findings Past Medical History: No date: Acquired hypothyroidism No date: Anxiety No date: Chest pain, non-cardiac No date: Chronic dyspnea No date: Coronary artery calcification No date: Degenerative arthritis of right knee No date: Depression No date: Diverticulosis No date: GERD (gastroesophageal reflux disease) No date: Hashimoto's thyroiditis No date: History of hiatal hernia 2004: Hyperlipidemia No date: Hyperlipidemia No date: Osteoporosis No date: Primary hypertension No date: Thrombocytopenia (HCC) No date: Vitamin B 12 deficiency No date: Vitamin D deficiency  Past Surgical History: 05/08/1977: CESAREAN SECTION 06/25/1979: CESAREAN SECTION 02/14/2005: COLONOSCOPY 07/02/2018: COLONOSCOPY WITH PROPOFOL ; N/A     Comment:  Procedure: COLONOSCOPY WITH PROPOFOL ;  Surgeon:               Deveron Fly, MD;  Location: [facility] ENDOSCOPY;                Service: Endoscopy;  Laterality: N/A; 09/03/2018: COLONOSCOPY WITH PROPOFOL ; N/A     Comment:  Procedure: COLONOSCOPY WITH  PROPOFOL ;  Surgeon:               Deveron Fly, MD;  Location: [facility] ENDOSCOPY;                Service: Endoscopy;  Laterality: N/A; 1963: TONSILLECTOMY 2001: TOTAL ABDOMINAL HYSTERECTOMY W/ BILATERAL SALPINGOOPHORECTOMY 06/25/1979: TUBAL LIGATION  BMI    Body Mass Index: 27.75 kg/m      Reproductive/Obstetrics negative OB ROS                             Anesthesia Physical Anesthesia Plan  ASA: 3  Anesthesia Plan: Spinal   Post-op Pain Management:    Induction:   PONV Risk Score and Plan:   Airway Management Planned: Natural Airway and Nasal Cannula  Additional Equipment:   Intra-op Plan:   Post-operative Plan:   Informed Consent: I have reviewed the patients History and Physical, chart, labs and discussed the procedure including the risks, benefits and alternatives for the proposed anesthesia with the patient or authorized representative who has indicated his/her understanding and acceptance.     Dental Advisory Given  Plan Discussed with: Anesthesiologist, CRNA and Surgeon  Anesthesia Plan Comments: (History of spinal headache with her C section. She reports a spinal since then with no problems. Consented that dural puncture headache is a risk but that her risk is low based off of her known risk factors and history. She voiced assent.  Patient reports no bleeding problems and no anticoagulant use.  Plan for spinal with backup [place]  Patient consented for risks of anesthesia including but not limited to:  - adverse reactions to medications - damage to eyes, teeth, lips or other oral mucosa - nerve damage due to positioning  - risk of bleeding, infection and or nerve damage from spinal that could lead to paralysis - risk of headache or failed spinal - damage to teeth, lips or other oral mucosa - sore throat or hoarseness - damage to heart, brain, nerves, lungs, other parts of body or loss of life  Patient voiced understanding and  assent.)       Anesthesia Quick Evaluation

## 2024-03-28 NOTE — Interval H&P Note (Signed)
 History and Physical Interval Note:  03/28/2024 11:20 AM  Audrey Finley  has presented today for surgery, with the diagnosis of PRIMARY OSTEOARTHRITIS OF RIGHT KNEE..  The various methods of treatment have been discussed with the patient and family. After consideration of risks, benefits and other options for treatment, the patient has consented to  Procedure(s): ARTHROPLASTY, KNEE, TOTAL, USING IMAGELESS COMPUTER-ASSISTED NAVIGATION (Right) as a surgical intervention.  The patient's history has been reviewed, patient examined, no change in status, stable for surgery.  I have reviewed the patient's chart and labs.  Questions were answered to the patient's satisfaction.     Audrey Finley P Arlo Butt

## 2024-03-29 ENCOUNTER — Encounter: Payer: Self-pay | Admitting: Orthopedic Surgery

## 2024-03-29 DIAGNOSIS — Z96651 Presence of right artificial knee joint: Secondary | ICD-10-CM | POA: Diagnosis not present

## 2024-03-29 DIAGNOSIS — M1711 Unilateral primary osteoarthritis, right knee: Secondary | ICD-10-CM | POA: Diagnosis not present

## 2024-03-29 MED ORDER — ASPIRIN 81 MG PO TBEC: 81.0000 mg | DELAYED_RELEASE_TABLET | Freq: Two times a day (BID) | ORAL | Status: AC

## 2024-03-29 MED ORDER — TRAMADOL HCL 50 MG PO TABS: 50.0000 mg | ORAL_TABLET | ORAL | 0 refills | Status: DC | PRN

## 2024-03-29 MED ORDER — MELOXICAM 15 MG PO TABS: 15.0000 mg | ORAL_TABLET | Freq: Every day | ORAL | 1 refills | Status: AC

## 2024-03-29 MED ORDER — ACETAMINOPHEN 10 MG/ML IV SOLN
INTRAVENOUS | Status: AC
  Filled 2024-03-29: qty 100

## 2024-03-29 MED ORDER — OXYCODONE HCL 5 MG PO TABS: 5.0000 mg | ORAL_TABLET | ORAL | 0 refills | Status: DC | PRN

## 2024-03-29 NOTE — Plan of Care (Signed)
  Problem: Education: Goal: Knowledge of the prescribed therapeutic regimen will improve Outcome: Progressing   Problem: Activity: Goal: Ability to avoid complications of mobility impairment will improve Outcome: Progressing   Problem: Education: Goal: Knowledge of General Education information will improve Description: Including pain rating scale, medication(s)/side effects and non-pharmacologic comfort measures Outcome: Progressing   Problem: Activity: Goal: Risk for activity intolerance will decrease Outcome: Progressing   Problem: Nutrition: Goal: Adequate nutrition will be maintained Outcome: Progressing

## 2024-03-29 NOTE — Plan of Care (Signed)
  Problem: Activity: Goal: Range of joint motion will improve Outcome: Progressing   Problem: Pain Management: Goal: Pain level will decrease with appropriate interventions Outcome: Progressing

## 2024-03-29 NOTE — TOC Transition Note (Signed)
 Transition of Care [facility]) - Discharge Note   Patient Details  Name: Audrey Finley MRN: [id number] Date of Birth: 1951/08/14  Transition of Care [facility]) CM/SW Contact:  Alexandra Ice, RN Phone Number: 03/29/2024, 10:21 AM   Clinical Narrative:    Patient to discharge today, home with home health services. Patient needs [facility], referral sent to Jon with [employer] for processing. [facility] set up by surgeon's office prior to surgery. [place]  notified of patient's discharge today. BSC to be delivered to bedside.    Barriers to Discharge: Barriers Resolved   Patient Goals and CMS Choice Patient states their goals for this hospitalization and ongoing recovery are:: feel better      Expected Discharge Plan and Services           Expected Discharge Date: 03/29/24               DME Arranged: Bedside commode DME Agency: [employer] Date DME Agency Contacted: 03/29/24 Time DME Agency Contacted: 1021 Representative spoke with at DME Agency: Sam Creighton HH Arranged: PT, OT HH Agency: [facility] Date [facility] Agency Contacted: 03/29/24 Time HH Agency Contacted: 1021 Representative spoke with at [facility] Agency: [place]   Prior Living Arrangements/Services                       Activities of Daily Living   ADL Screening (condition at time of admission) Independently performs ADLs?: Yes (appropriate for developmental age) Is the patient deaf or have difficulty hearing?: No Does the patient have difficulty seeing, even when wearing glasses/contacts?: No Does the patient have difficulty concentrating, remembering, or making decisions?: No  Permission Sought/Granted                  Emotional Assessment              Admission diagnosis:  Primary osteoarthritis of right knee [M17.11] History of total knee arthroplasty, right [Z96.651] Patient Active Problem List   Diagnosis Date Noted   History of total knee arthroplasty, right 03/28/2024   Degenerative disc  disease at L5-S1 level 03/09/2024   Degenerative disc disease, cervical 03/09/2024   Osteoarthritis (arthritis due to wear and tear of joints) 03/09/2024   Osteoarthritis of right knee 08/21/2023   Pain in right knee 08/21/2023   Coronary artery calcification 06/17/2023   Exertional dyspnea 06/17/2023   Strain of right trapezius muscle 11/26/2022   Vitamin B12 deficiency 02/28/2018   Anxiety and depression 05/13/2014   Chest pressure 05/13/2014   Essential (primary) hypertension 05/13/2014   Hashimoto's thyroiditis 05/13/2014   Hyperlipidemia 05/13/2014   Hypothyroidism 05/13/2014   Osteoporosis, post-menopausal 05/13/2014   PCP:  Lyle San, MD Pharmacy:   [facility] [address] [place] - [address] [place] [address] Phone: [phone] Fax: [phone]     Social Determinants of Health (SDOH) Interventions    Readmission Risk Interventions     No data to display            Final next level of care: Home w Home Health Services Barriers to Discharge: Barriers Resolved   Patient Goals and CMS Choice Patient states their goals for this hospitalization and ongoing recovery are:: feel better          Discharge Placement                  Name of family member notified: Lita Rieger, spouse Patient and family notified of of transfer: 03/29/24  Discharge  Plan and Services Additional resources added to the After Visit Summary for                  DME Arranged: Bedside commode DME Agency: [employer] Date DME Agency Contacted: 03/29/24 Time DME Agency Contacted: 1021 Representative spoke with at DME Agency: Sam Creighton HH Arranged: PT, OT [facility] Agency: [facility] Date [facility] Agency Contacted: 03/29/24 Time HH Agency Contacted: 1021 Representative spoke with at [facility] Agency: [place]   Social Drivers of Health (SDOH) Interventions SDOH Screenings   Food Insecurity: No Food Insecurity (03/28/2024)  Housing: Low Risk   (03/28/2024)  Transportation Needs: No Transportation Needs (03/28/2024)  Utilities: Not At Risk (03/28/2024)  Financial Resource Strain: Low Risk  (12/07/2023)   Received from [facility]  Social Connections: Socially Integrated (03/28/2024)  Tobacco Use: Low Risk  (03/28/2024)     Readmission Risk Interventions     No data to display

## 2024-03-29 NOTE — Anesthesia Postprocedure Evaluation (Signed)
 Anesthesia Post Note  Patient: Audrey Finley  Procedure(s) Performed: ARTHROPLASTY, KNEE, TOTAL, USING IMAGELESS COMPUTER-ASSISTED NAVIGATION (Right: Knee)  Patient location during evaluation: Nursing Unit Anesthesia Type: Spinal Pain management: pain level controlled Respiratory status: spontaneous breathing Cardiovascular status: stable Postop Assessment: no headache Anesthetic complications: no   There were no known notable events for this encounter.   Last Vitals:  Vitals:   03/28/24 2032 03/29/24 0415  BP: 96/67 112/73  Pulse: 72 62  Resp: 18 16  Temp: 36.4 C 36.5 C  SpO2: 95% 98%    Last Pain:  Vitals:   03/29/24 0625  TempSrc:   PainSc: Asleep                 Curvin Downing

## 2024-03-29 NOTE — Discharge Summary (Signed)
 Physician Discharge Summary  Subjective: 1 Day Post-Op Procedure(s) (LRB): ARTHROPLASTY, KNEE, TOTAL, USING IMAGELESS COMPUTER-ASSISTED NAVIGATION (Right) Patient reports pain as mild.   Patient seen in rounds with Dr. Aubry Blase. Patient is well, and has had no acute complaints or problems. Denies any CP, SOB, N/V, fevers or chills We will start therapy today.  Patient is ready to go home  Physician Discharge Summary  Patient ID: Audrey Finley MRN: [id number] DOB/AGE: 09/17/1951 73 y.o.  Admit date: 03/28/2024 Discharge date: 03/29/2024  Admission Diagnoses:  Discharge Diagnoses:  Principal Problem:   History of total knee arthroplasty, right   Discharged Condition: good  Hospital Course: Patient presented to the hospital on 03/28/2024 for an elective right total knee arthroplasty performed by Dr. Aubry Blase. Patient was given 1g of TXA and 2g of Ancef prior to the procedure. she tolerated the procedure well without any complications. See procedural note below for details. Postoperatively, the patient did very well. she was able to pass PT protocols on post-op day one without any issues. JP drain was removed without any difficulty and was intact. she was able to void her bladder without any difficulty. Physical exam was unremarkable. she denies any SOB, CP, N/V, fevers or chills. Vital signs are stable. Patient is stable to discharge home.  PROCEDURE:  Right total knee arthroplasty using computer-assisted navigation   SURGEON:  Maxene Span. M.D.   ASSISTANT:  Benjiman Bras, PA-C (present and scrubbed throughout the case, critical for assistance with exposure, retraction, instrumentation, and closure)   ANESTHESIA: spinal   ESTIMATED BLOOD LOSS: 50 mL   FLUIDS REPLACED: 600 mL of crystalloid   TOURNIQUET TIME: 94 minutes   DRAINS: 2 medium Hemovac drains   SOFT TISSUE RELEASES: Anterior cruciate ligament, posterior cruciate ligament, deep medial collateral ligament,  patellofemoral ligament   IMPLANTS UTILIZED: DePuy Attune size 6N posterior stabilized femoral component (cemented), size 5 rotating platform tibial component (cemented), 35 mm medialized dome patella (cemented), and a 5 mm stabilized rotating platform polyethylene insert.  Treatments: none  Discharge Exam: Blood pressure 112/73, pulse 62, temperature 97.7 F (36.5 C), temperature source Temporal, resp. rate 16, height 5\' 6"  (1.676 m), weight 78 kg, SpO2 98%.   Disposition: home   Allergies as of 03/29/2024   No Known Allergies      Medication List     TAKE these medications    acetaminophen 650 MG CR tablet Commonly known as: TYLENOL Take 650 mg by mouth every 8 (eight) hours as needed for pain.   aspirin EC 81 MG tablet Take 1 tablet (81 mg total) by mouth 2 (two) times daily. Swallow whole. What changed: when to take this   atorvastatin 20 MG tablet Commonly known as: LIPITOR Take 20 mg by mouth daily.   benazepril-hydrochlorthiazide 20-25 MG tablet Commonly known as: LOTENSIN HCT Take 1 tablet by mouth daily.   busPIRone 10 MG tablet Commonly known as: BUSPAR Take 10 mg by mouth 2 (two) times daily.   [employer] B12 GUMMIES PO Take 2 tablets by mouth daily.   docusate sodium 100 MG capsule Commonly known as: COLACE Take 100 mg by mouth 2 (two) times daily.   levothyroxine 75 MCG tablet Commonly known as: SYNTHROID Take 75 mcg by mouth daily before breakfast.   meloxicam 15 MG tablet Commonly known as: MOBIC Take 1 tablet (15 mg total) by mouth daily.   omeprazole 20 MG capsule Commonly known as: PRILOSEC Take 20 mg by mouth 2 (two) times daily.  OVER THE COUNTER MEDICATION Take 2 tablets by mouth daily. Collagen w/ Biotin gummies   oxyCODONE 5 MG immediate release tablet Commonly known as: Oxy IR/ROXICODONE Take 1 tablet (5 mg total) by mouth every 4 (four) hours as needed for moderate pain (pain score 4-6) (pain score 4-6).   phentermine 37.5 MG  tablet Commonly known as: ADIPEX-P Take 37.5 mg by mouth every morning.   polyethylene glycol 17 g packet Commonly known as: MIRALAX / GLYCOLAX Take 17 g by mouth daily.   topiramate 50 MG tablet Commonly known as: TOPAMAX Take 50 mg by mouth at bedtime.   traMADol 50 MG tablet Commonly known as: ULTRAM Take 1-2 tablets (50-100 mg total) by mouth every 4 (four) hours as needed for moderate pain (pain score 4-6).   venlafaxine XR 75 MG 24 hr capsule Commonly known as: EFFEXOR-XR Take 75 mg by mouth daily.   Vitamin C 500 MG Chew Chew 1,000 mg by mouth daily.   vitamin E 180 MG (400 UNITS) capsule Take 400 Units by mouth daily.               Durable Medical Equipment  (From admission, onward)           Start     Ordered   03/28/24 1749  DME Walker rolling  Once       Question:  Patient needs a walker to treat with the following condition  Answer:  Total knee replacement status   03/28/24 1748   03/28/24 1749  DME Bedside commode  Once       Comments: Patient is not able to walk the distance required to go the bathroom, or he/she is unable to safely negotiate stairs required to access the bathroom.  A 3in1 BSC will alleviate this problem  Question:  Patient needs a bedside commode to treat with the following condition  Answer:  Total knee replacement status   03/28/24 1748            Follow-up Information     Wadie Guile, PA-C Follow up on 04/12/2024.   Specialty: Orthopedic Surgery Why: at 10:15am Contact information: [address] [place] [address] [phone]         Arlyne Lame, MD Follow up on 05/10/2024.   Specialty: Orthopedic Surgery Why: at 2:00pm Contact information: [address] [facility] [place] [address] [phone]                 Signed: Benjiman Bras 03/29/2024, 8:33 AM   Objective: Vital signs in last 24 hours: Temp:  [97.3 F (36.3 C)-98.3 F (36.8 C)] 97.7 F (36.5  C) (04/29 0415) Pulse Rate:  [62-77] 62 (04/29 0415) Resp:  [11-18] 16 (04/29 0415) BP: (96-134)/(67-87) 112/73 (04/29 0415) SpO2:  [95 %-100 %] 98 % (04/29 0415) Weight:  [78 kg] 78 kg (04/28 1012)  Intake/Output from previous day:  Intake/Output Summary (Last 24 hours) at 03/29/2024 0833 Last data filed at 03/29/2024 0415 Gross per 24 hour  Intake 1947.34 ml  Output 750 ml  Net 1197.34 ml    Intake/Output this shift: No intake/output data recorded.  Labs: No results for input(s): "HGB" in the last 72 hours. No results for input(s): "WBC", "RBC", "HCT", "PLT" in the last 72 hours. No results for input(s): "NA", "K", "CL", "CO2", "BUN", "CREATININE", "GLUCOSE", "CALCIUM" in the last 72 hours. No results for input(s): "LABPT", "INR" in the last 72 hours.  EXAM: General - Patient is Alert,  Appropriate, and Oriented Extremity - Neurologically intact Neurovascular intact Sensation intact distally Intact pulses distally Dorsiflexion/Plantar flexion intact No cellulitis present Compartment soft Dressing - dressing C/D/I and no drainage Motor Function - intact, moving foot and toes well on exam.  JP Drain pulled without difficulty. Intact  Assessment/Plan: 1 Day Post-Op Procedure(s) (LRB): ARTHROPLASTY, KNEE, TOTAL, USING IMAGELESS COMPUTER-ASSISTED NAVIGATION (Right) Procedure(s) (LRB): ARTHROPLASTY, KNEE, TOTAL, USING IMAGELESS COMPUTER-ASSISTED NAVIGATION (Right) Past Medical History:  Diagnosis Date   Acquired hypothyroidism    Anxiety    Chest pain, non-cardiac    Chronic dyspnea    Coronary artery calcification    Degenerative arthritis of right knee    Depression    Diverticulosis    GERD (gastroesophageal reflux disease)    Hashimoto's thyroiditis    History of hiatal hernia    Hyperlipidemia 2004   Hyperlipidemia    Osteoporosis    Primary hypertension    Thrombocytopenia (HCC)    Vitamin B 12 deficiency    Vitamin D deficiency    Principal Problem:    History of total knee arthroplasty, right  Estimated body mass index is 27.75 kg/m as calculated from the following:   Height as of this encounter: 5\' 6"  (1.676 m).   Weight as of this encounter: 78 kg.  Patient will continue to work with physical therapy   Discussed with the patient continuing to utilize Polar Care   Patient will use bone foam in 20-30 minute intervals   Patient will wear TED hose bilaterally to help prevent DVT and clot formation   Discussed the Aquacel bandage.  This bandage will stay in place 7 days postoperatively.  Can be replaced with honeycomb bandages that will be sent home with the patient   Discussed sending the patient home with tramadol and oxycodone for as needed pain management.  Patient will also continue with at home meloxicam to help with swelling and inflammation.  Patient will take an 81 mg aspirin twice daily for DVT prophylaxis   JP drain removed without difficulty, intact   Weight-Bearing as tolerated to right leg   Patient will follow-up with [facility] orthopedics in 2 weeks for staple removal and reevaluation  Diet - Regular diet Follow up - in 2 weeks Activity - WBAT Disposition - Home Condition Upon Discharge - Good DVT Prophylaxis - Aspirin and TED hose  Standley Earing, PA-C Orthopaedic Surgery 03/29/2024, 8:33 AM

## 2024-03-29 NOTE — Progress Notes (Signed)
 DISCHARGE NOTE:   Pt was dc with IV removed and dc instructions given. Pt 3 in 1 delivered to hospital room. Pt given 2 honeycomb dressings, polar care, bone foam, medication scripts, and both TED hose on and in place. Pt wheeled down to medical mall entrance by staff and pt's husband provided transportation.

## 2024-03-29 NOTE — Progress Notes (Signed)
 Subjective: 1 Day Post-Op Procedure(s) (LRB): ARTHROPLASTY, KNEE, TOTAL, USING IMAGELESS COMPUTER-ASSISTED NAVIGATION (Right) Patient reports pain as mild.   Patient seen in rounds with Dr. Aubry Blase. Patient is well, and has had no acute complaints or problems. Denies any CP, SOB, N/V, fevers or chills We will start therapy today.  Plan is to go Home after hospital stay.  Objective: Vital signs in last 24 hours: Temp:  [97.3 F (36.3 C)-98.3 F (36.8 C)] 97.7 F (36.5 C) (04/29 0415) Pulse Rate:  [62-77] 62 (04/29 0415) Resp:  [11-18] 16 (04/29 0415) BP: (96-134)/(67-87) 112/73 (04/29 0415) SpO2:  [95 %-100 %] 98 % (04/29 0415) Weight:  [78 kg] 78 kg (04/28 1012)  Intake/Output from previous day:  Intake/Output Summary (Last 24 hours) at 03/29/2024 0812 Last data filed at 03/29/2024 0415 Gross per 24 hour  Intake 1947.34 ml  Output 750 ml  Net 1197.34 ml    Intake/Output this shift: No intake/output data recorded.  Labs: No results for input(s): "HGB" in the last 72 hours. No results for input(s): "WBC", "RBC", "HCT", "PLT" in the last 72 hours. No results for input(s): "NA", "K", "CL", "CO2", "BUN", "CREATININE", "GLUCOSE", "CALCIUM" in the last 72 hours. No results for input(s): "LABPT", "INR" in the last 72 hours.  EXAM General - Patient is Alert, Appropriate, and Oriented Extremity - Neurologically intact Neurovascular intact Sensation intact distally Intact pulses distally Dorsiflexion/Plantar flexion intact No cellulitis present Compartment soft Dressing - dressing C/D/I and no drainage Motor Function - intact, moving foot and toes well on exam.  JP Drain pulled without difficulty. Intact  Past Medical History:  Diagnosis Date   Acquired hypothyroidism    Anxiety    Chest pain, non-cardiac    Chronic dyspnea    Coronary artery calcification    Degenerative arthritis of right knee    Depression    Diverticulosis    GERD (gastroesophageal reflux disease)     Hashimoto's thyroiditis    History of hiatal hernia    Hyperlipidemia 2004   Hyperlipidemia    Osteoporosis    Primary hypertension    Thrombocytopenia (HCC)    Vitamin B 12 deficiency    Vitamin D deficiency     Assessment/Plan: 1 Day Post-Op Procedure(s) (LRB): ARTHROPLASTY, KNEE, TOTAL, USING IMAGELESS COMPUTER-ASSISTED NAVIGATION (Right) Principal Problem:   History of total knee arthroplasty, right  Estimated body mass index is 27.75 kg/m as calculated from the following:   Height as of this encounter: 5\' 6"  (1.676 m).   Weight as of this encounter: 78 kg. Advance diet Up with therapy  Patient will continue to work with physical therapy to pass postoperative PT protocols, ROM and strengthening  Discussed with the patient continuing to utilize Polar Care  Patient will use bone foam in 20-30 minute intervals  Patient will wear TED hose bilaterally to help prevent DVT and clot formation  Discussed the Aquacel bandage.  This bandage will stay in place 7 days postoperatively.  Can be replaced with honeycomb bandages that will be sent home with the patient  Discussed sending the patient home with tramadol and oxycodone for as needed pain management.  Patient will also be continue with home meloxicam to help with swelling and inflammation.  Patient will take an 81 mg aspirin twice daily for DVT prophylaxis  JP drain removed without difficulty, intact  Weight-Bearing as tolerated to right leg  Patient will follow-up with [facility] orthopedics in 2 weeks for staple removal and reevaluation  Wadie Guile, PA-C  [facility] Orthopaedics 03/29/2024, 8:12 AM

## 2024-03-29 NOTE — Evaluation (Signed)
 Physical Therapy Evaluation Patient Details Name: Audrey Finley MRN: [id number] DOB: 08-05-1951 Today's Date: 03/29/2024  History of Present Illness  73 y.o. female s/p R total knee arthroplasty on 03/28/24. PMH: HTN, thyroid  disease, anxiety  Clinical Impression  Patient admitted following above procedure. PTA, patient lives with husband and was using [facility] for mobility due to R knee pain. Patient stood from recliner with supervision. Ambulated 200' with RW and supervision. Negotiated 4 stairs with B handrails and supervision. Educated patient on HEP handout and RLE positioning, patient verbalized understanding. Patient will benefit from skilled PT services during acute stay to address listed deficits. Patient will benefit from ongoing therapy at discharge to maximize functional independence and safety.         If plan is discharge home, recommend the following: A little help with walking and/or transfers;A little help with bathing/dressing/bathroom;Assistance with cooking/housework;Assist for transportation;Help with stairs or ramp for entrance   Can travel by private vehicle        Equipment Recommendations Rolling Ritamarie Arkin (2 wheels);BSC/3in1  Recommendations for Other Services       Functional Status Assessment Patient has had a recent decline in their functional status and demonstrates the ability to make significant improvements in function in a reasonable and predictable amount of time.     Precautions / Restrictions Precautions Precautions: Fall Recall of Precautions/Restrictions: Intact Restrictions Weight Bearing Restrictions Per Provider Order: Yes RLE Weight Bearing Per Provider Order: Weight bearing as tolerated      Mobility  Bed Mobility Overal bed mobility: Needs Assistance             General bed mobility comments: In recliner on arrival    Transfers Overall transfer level: Needs assistance Equipment used: Rolling Janne Faulk (2 wheels) Transfers: Sit  to/from Stand Sit to Stand: Supervision                Ambulation/Gait Ambulation/Gait assistance: Supervision Gait Distance (Feet): 200 Feet Assistive device: Rolling Travus Oren (2 wheels) Gait Pattern/deviations: Step-to pattern, Decreased stride length Gait velocity: decreased     General Gait Details: supervision for safety. Cues for heel strike on R for knee extension  Stairs Stairs: Yes Stairs assistance: Supervision Stair Management: Two rails, Step to pattern, Forwards Number of Stairs: 4    Wheelchair Mobility     Tilt Bed    Modified Rankin (Stroke Patients Only)       Balance Overall balance assessment: Mild deficits observed, not formally tested                                           Pertinent Vitals/Pain Pain Assessment Pain Assessment: Faces Faces Pain Scale: Hurts little more Pain Location: R knee Pain Descriptors / Indicators: Grimacing, Guarding, Discomfort Pain Intervention(s): Limited activity within patient's tolerance, Monitored during session, Repositioned    Home Living Family/patient expects to be discharged to:: Private residence Living Arrangements: Spouse/significant other Available Help at Discharge: Family;Available 24 hours/day Type of Home: House Home Access: Stairs to enter Entrance Stairs-Rails: Right;Left Entrance Stairs-Number of Steps: 4-5 Alternate Level Stairs-Number of Steps: flight Home Layout: Two level Home Equipment: Agricultural consultant (2 wheels);Cane - single point      Prior Function Prior Level of Function : Independent/Modified Independent                     Extremity/Trunk Assessment   Upper Extremity  Assessment Upper Extremity Assessment: Overall WFL for tasks assessed    Lower Extremity Assessment Lower Extremity Assessment: RLE deficits/detail RLE Deficits / Details: deficits consistent with post op pain and weakness - R knee flexion ~60 degrees    Cervical / Trunk  Assessment Cervical / Trunk Assessment: Normal  Communication   Communication Communication: No apparent difficulties    Cognition Arousal: Alert Behavior During Therapy: WFL for tasks assessed/performed   PT - Cognitive impairments: No apparent impairments                         Following commands: Intact       Cueing Cueing Techniques: Verbal cues     General Comments      Exercises Other Exercises Other Exercises: HEP handout provided and instructed   Assessment/Plan    PT Assessment Patient needs continued PT services  PT Problem List Decreased strength;Decreased balance;Decreased activity tolerance       PT Treatment Interventions DME instruction;Gait training;Functional mobility training;Therapeutic activities;Therapeutic exercise;Balance training;Neuromuscular re-education;Patient/family education    PT Goals (Current goals can be found in the Care Plan section)  Acute Rehab PT Goals Patient Stated Goal: to go home PT Goal Formulation: With patient Time For Goal Achievement: 04/12/24 Potential to Achieve Goals: Good    Frequency BID     Co-evaluation               AM-PAC PT "6 Clicks" Mobility  Outcome Measure Help needed turning from your back to your side while in a flat bed without using bedrails?: A Little Help needed moving from lying on your back to sitting on the side of a flat bed without using bedrails?: A Little Help needed moving to and from a bed to a chair (including a wheelchair)?: A Little Help needed standing up from a chair using your arms (e.g., wheelchair or bedside chair)?: A Little Help needed to walk in hospital room?: A Little Help needed climbing 3-5 steps with a railing? : A Little 6 Click Score: 18    End of Session   Activity Tolerance: Patient tolerated treatment well Patient left: in chair;with call bell/phone within reach Nurse Communication: Mobility status PT Visit Diagnosis: Muscle weakness  (generalized) (M62.81);Unsteadiness on feet (R26.81)    Time: 8295-6213 PT Time Calculation (min) (ACUTE ONLY): 20 min   Charges:   PT Evaluation $PT Eval Moderate Complexity: 1 Mod   PT General Charges $$ ACUTE PT VISIT: 1 Visit         Janine Melbourne, PT, DPT Physical Therapist - [facility]  [facility]   Egidio Lofgren A Syncere Eble 03/29/2024, 12:37 PM

## 2024-03-29 NOTE — Evaluation (Signed)
 Occupational Therapy Evaluation Patient Details Name: Audrey Finley MRN: [id number] DOB: Apr 23, 1951 Today's Date: 03/29/2024   History of Present Illness   73 y.o. female s/p R total knee arthroplasty on 03/28/24. PMH: HTN, thyroid  disease, anxiety     Clinical Impressions Pt seen for OT evaluation this date, POD#1 from above surgery. Pt was independent in all ADLs prior to surgery, however occasionally using SPC for mobility due to R knee pain. Pt is eager to return to PLOF with less pain and improved safety and independence. Pt currently requires SBA assist for LB dressing while in seated position due to pain and limited AROM of R knee. Pt instructed in polar care mgt, falls prevention strategies, home/routines modifications, DME/AE for LB bathing and dressing tasks, and compression stocking mgt. Handout provided. Provided visual and verbal demo of TTB; recommending TTB + BSC for at home use. Do not currently anticipate any OT needs following this hospitalization.        If plan is discharge home, recommend the following:   A little help with walking and/or transfers;A little help with bathing/dressing/bathroom;Assistance with cooking/housework;Assist for transportation     Functional Status Assessment   Patient has had a recent decline in their functional status and demonstrates the ability to make significant improvements in function in a reasonable and predictable amount of time.     Equipment Recommendations   BSC/3in1;Tub/shower bench      Precautions/Restrictions   Precautions Precautions: Fall Restrictions Weight Bearing Restrictions Per Provider Order: Yes RLE Weight Bearing Per Provider Order: Weight bearing as tolerated     Mobility Bed Mobility Overal bed mobility: Needs Assistance             General bed mobility comments: NT, pt recieved working with PT and left in recliner    Transfers Overall transfer level: Needs assistance Equipment used:  Rolling walker (2 wheels) Transfers: Sit to/from Stand Sit to Stand: Supervision           General transfer comment: steady during transfers from recliner      Balance Overall balance assessment: Mild deficits observed, not formally tested                                         ADL either performed or assessed with clinical judgement   ADL Overall ADL's : Needs assistance/impaired                 Upper Body Dressing : Standing;Supervision/safety Upper Body Dressing Details (indicate cue type and reason): doffs gown/dons shirt Lower Body Dressing: Supervision/safety;Sit to/from stand;Maximal assistance Lower Body Dressing Details (indicate cue type and reason): MAX A to don TED hose; supervision to don pants. min vcs for sequencing for safety and task segmentation to prioritize balance prior to pulling pants over hips. able to return demo with SBA for safety, alternating unilateral UE support on RW fading to no UE support to complete donning pants Toilet Transfer: Supervision/safety;Rolling walker (2 wheels);Transfer board;BSC/3in1           Functional mobility during ADLs: Supervision/safety;Rolling walker (2 wheels) General ADL Comments: UB/LB dressing completed no physical assist. min vcs overall     Vision Baseline Vision/History: 1 Wears glasses Ability to See in Adequate Light: 0 Adequate Vision Assessment?: Wears glasses for reading            Pertinent Vitals/Pain Pain Assessment Pain Assessment: Faces Faces  Pain Scale: Hurts little more Pain Location: R knee Pain Descriptors / Indicators: Grimacing, Guarding, Discomfort Pain Intervention(s): Limited activity within patient's tolerance, Ice applied     Extremity/Trunk Assessment Upper Extremity Assessment Upper Extremity Assessment: Overall WFL for tasks assessed;Right hand dominant   Lower Extremity Assessment Lower Extremity Assessment: Overall WFL for tasks assessed;Defer to PT  evaluation   Cervical / Trunk Assessment Cervical / Trunk Assessment: Normal   Communication Communication Communication: No apparent difficulties   Cognition Arousal: Alert Behavior During Therapy: WFL for tasks assessed/performed Cognition: No apparent impairments                               Following commands: Intact       Cueing  General Comments   Cueing Techniques: Verbal cues      Exercises Exercises: Other exercises Other Exercises Other Exercises: edu on role and purpose of OT; knee precautions, use of polar care, LB AE for ADL independence and visual/verbal demo of TTB for use when cleared to shower   Shoulder Instructions      Home Living Family/patient expects to be discharged to:: Private residence Living Arrangements: Spouse/significant other Available Help at Discharge: Family;Available 24 hours/day Type of Home: House Home Access: Stairs to enter [employer] of Steps: 4-5 Entrance Stairs-Rails: Right;Left Home Layout: Two level Alternate Level Stairs-Number of Steps: flight Alternate Level Stairs-Rails: Right Bathroom Shower/Tub: Chief Strategy Officer: Standard     Home Equipment: Agricultural consultant (2 wheels);Cane - single point          Prior Functioning/Environment Prior Level of Function : Independent/Modified Independent                    OT Problem List: Decreased range of motion;Decreased strength;Impaired balance (sitting and/or standing);Decreased knowledge of use of DME or AE;Decreased knowledge of precautions;Pain;Increased edema   OT Treatment/Interventions:        OT Goals(Current goals can be found in the care plan section)   Acute Rehab OT Goals OT Goal Formulation: All assessment and education complete, DC therapy   OT Frequency:          AM-PAC OT "6 Clicks" Daily Activity     Outcome Measure Help from another person eating meals?: None Help from another person taking  care of personal grooming?: None Help from another person toileting, which includes using toliet, bedpan, or urinal?: A Little Help from another person bathing (including washing, rinsing, drying)?: None Help from another person to put on and taking off regular upper body clothing?: None Help from another person to put on and taking off regular lower body clothing?: A Little 6 Click Score: 22   End of Session Equipment Utilized During Treatment: Rolling walker (2 wheels) Nurse Communication: Mobility status;Other (comment) (ADL edu complete)  Activity Tolerance: Patient tolerated treatment well;No increased pain Patient left: in chair;with call bell/phone within reach  OT Visit Diagnosis: Other abnormalities of gait and mobility (R26.89);Pain Pain - Right/Left: Right Pain - part of body: Leg;Knee                Time: 1610-9604 OT Time Calculation (min): 27 min Charges:  OT General Charges $OT Visit: 1 Visit OT Evaluation $OT Eval Low Complexity: 1 Low OT Treatments $Self Care/Home Management : 8-22 mins Donelle Baba L. Jaryan Chicoine, OTR/L  03/29/24, 10:43 AM

## 2024-03-30 DIAGNOSIS — D696 Thrombocytopenia, unspecified: Secondary | ICD-10-CM | POA: Diagnosis not present

## 2024-03-30 DIAGNOSIS — E063 Autoimmune thyroiditis: Secondary | ICD-10-CM | POA: Diagnosis not present

## 2024-03-30 DIAGNOSIS — Z96651 Presence of right artificial knee joint: Secondary | ICD-10-CM | POA: Diagnosis not present

## 2024-03-30 DIAGNOSIS — K5790 Diverticulosis of intestine, part unspecified, without perforation or abscess without bleeding: Secondary | ICD-10-CM | POA: Diagnosis not present

## 2024-03-30 DIAGNOSIS — Z90710 Acquired absence of both cervix and uterus: Secondary | ICD-10-CM | POA: Diagnosis not present

## 2024-03-30 DIAGNOSIS — Z791 Long term (current) use of non-steroidal anti-inflammatories (NSAID): Secondary | ICD-10-CM | POA: Diagnosis not present

## 2024-03-30 DIAGNOSIS — Z7982 Long term (current) use of aspirin: Secondary | ICD-10-CM | POA: Diagnosis not present

## 2024-03-30 DIAGNOSIS — M810 Age-related osteoporosis without current pathological fracture: Secondary | ICD-10-CM | POA: Diagnosis not present

## 2024-03-30 DIAGNOSIS — I10 Essential (primary) hypertension: Secondary | ICD-10-CM | POA: Diagnosis not present

## 2024-03-30 DIAGNOSIS — E538 Deficiency of other specified B group vitamins: Secondary | ICD-10-CM | POA: Diagnosis not present

## 2024-03-30 DIAGNOSIS — Z471 Aftercare following joint replacement surgery: Secondary | ICD-10-CM | POA: Diagnosis not present

## 2024-03-30 DIAGNOSIS — F32A Depression, unspecified: Secondary | ICD-10-CM | POA: Diagnosis not present

## 2024-03-30 DIAGNOSIS — F419 Anxiety disorder, unspecified: Secondary | ICD-10-CM | POA: Diagnosis not present

## 2024-03-30 DIAGNOSIS — E785 Hyperlipidemia, unspecified: Secondary | ICD-10-CM | POA: Diagnosis not present

## 2024-03-30 DIAGNOSIS — K219 Gastro-esophageal reflux disease without esophagitis: Secondary | ICD-10-CM | POA: Diagnosis not present

## 2024-04-12 DIAGNOSIS — E876 Hypokalemia: Secondary | ICD-10-CM | POA: Diagnosis not present

## 2024-04-12 DIAGNOSIS — M1711 Unilateral primary osteoarthritis, right knee: Secondary | ICD-10-CM | POA: Diagnosis not present

## 2024-04-12 DIAGNOSIS — M25561 Pain in right knee: Secondary | ICD-10-CM | POA: Diagnosis not present

## 2024-04-12 DIAGNOSIS — E039 Hypothyroidism, unspecified: Secondary | ICD-10-CM | POA: Diagnosis not present

## 2024-04-12 DIAGNOSIS — M25461 Effusion, right knee: Secondary | ICD-10-CM | POA: Diagnosis not present

## 2024-04-12 DIAGNOSIS — E785 Hyperlipidemia, unspecified: Secondary | ICD-10-CM | POA: Diagnosis not present

## 2024-04-12 DIAGNOSIS — I10 Essential (primary) hypertension: Secondary | ICD-10-CM | POA: Diagnosis not present

## 2024-04-13 DIAGNOSIS — E039 Hypothyroidism, unspecified: Secondary | ICD-10-CM | POA: Diagnosis not present

## 2024-04-13 DIAGNOSIS — I10 Essential (primary) hypertension: Secondary | ICD-10-CM | POA: Diagnosis not present

## 2024-04-13 DIAGNOSIS — M1711 Unilateral primary osteoarthritis, right knee: Secondary | ICD-10-CM | POA: Diagnosis not present

## 2024-04-13 DIAGNOSIS — E876 Hypokalemia: Secondary | ICD-10-CM | POA: Diagnosis not present

## 2024-04-13 DIAGNOSIS — E785 Hyperlipidemia, unspecified: Secondary | ICD-10-CM | POA: Diagnosis not present

## 2024-04-13 DIAGNOSIS — Z471 Aftercare following joint replacement surgery: Secondary | ICD-10-CM | POA: Diagnosis not present

## 2024-04-14 DIAGNOSIS — M25461 Effusion, right knee: Secondary | ICD-10-CM | POA: Diagnosis not present

## 2024-04-14 DIAGNOSIS — M25561 Pain in right knee: Secondary | ICD-10-CM | POA: Diagnosis not present

## 2024-04-18 DIAGNOSIS — M25561 Pain in right knee: Secondary | ICD-10-CM | POA: Diagnosis not present

## 2024-04-18 DIAGNOSIS — M25461 Effusion, right knee: Secondary | ICD-10-CM | POA: Diagnosis not present

## 2024-04-20 DIAGNOSIS — M25561 Pain in right knee: Secondary | ICD-10-CM | POA: Diagnosis not present

## 2024-04-20 DIAGNOSIS — M25461 Effusion, right knee: Secondary | ICD-10-CM | POA: Diagnosis not present

## 2024-04-22 DIAGNOSIS — M25561 Pain in right knee: Secondary | ICD-10-CM | POA: Diagnosis not present

## 2024-04-22 DIAGNOSIS — M25461 Effusion, right knee: Secondary | ICD-10-CM | POA: Diagnosis not present

## 2024-04-26 DIAGNOSIS — M25561 Pain in right knee: Secondary | ICD-10-CM | POA: Diagnosis not present

## 2024-04-26 DIAGNOSIS — Z96651 Presence of right artificial knee joint: Secondary | ICD-10-CM | POA: Diagnosis not present

## 2024-04-28 DIAGNOSIS — M25661 Stiffness of right knee, not elsewhere classified: Secondary | ICD-10-CM | POA: Diagnosis not present

## 2024-04-28 DIAGNOSIS — Z96651 Presence of right artificial knee joint: Secondary | ICD-10-CM | POA: Diagnosis not present

## 2024-05-03 DIAGNOSIS — Z96651 Presence of right artificial knee joint: Secondary | ICD-10-CM | POA: Diagnosis not present

## 2024-05-03 DIAGNOSIS — M25661 Stiffness of right knee, not elsewhere classified: Secondary | ICD-10-CM | POA: Diagnosis not present

## 2024-05-05 DIAGNOSIS — M25661 Stiffness of right knee, not elsewhere classified: Secondary | ICD-10-CM | POA: Diagnosis not present

## 2024-05-05 DIAGNOSIS — Z96651 Presence of right artificial knee joint: Secondary | ICD-10-CM | POA: Diagnosis not present

## 2024-05-10 DIAGNOSIS — Z96651 Presence of right artificial knee joint: Secondary | ICD-10-CM | POA: Diagnosis not present

## 2024-05-10 DIAGNOSIS — M25661 Stiffness of right knee, not elsewhere classified: Secondary | ICD-10-CM | POA: Diagnosis not present

## 2024-05-12 DIAGNOSIS — Z96651 Presence of right artificial knee joint: Secondary | ICD-10-CM | POA: Diagnosis not present

## 2024-05-12 DIAGNOSIS — M25561 Pain in right knee: Secondary | ICD-10-CM | POA: Diagnosis not present

## 2024-05-17 DIAGNOSIS — M25661 Stiffness of right knee, not elsewhere classified: Secondary | ICD-10-CM | POA: Diagnosis not present

## 2024-05-17 DIAGNOSIS — Z96651 Presence of right artificial knee joint: Secondary | ICD-10-CM | POA: Diagnosis not present

## 2024-05-19 DIAGNOSIS — Z96651 Presence of right artificial knee joint: Secondary | ICD-10-CM | POA: Diagnosis not present

## 2024-05-19 DIAGNOSIS — M25661 Stiffness of right knee, not elsewhere classified: Secondary | ICD-10-CM | POA: Diagnosis not present

## 2024-05-24 DIAGNOSIS — D649 Anemia, unspecified: Secondary | ICD-10-CM | POA: Diagnosis not present

## 2024-05-24 DIAGNOSIS — Z96651 Presence of right artificial knee joint: Secondary | ICD-10-CM | POA: Diagnosis not present

## 2024-05-24 DIAGNOSIS — M25661 Stiffness of right knee, not elsewhere classified: Secondary | ICD-10-CM | POA: Diagnosis not present

## 2024-05-24 DIAGNOSIS — E039 Hypothyroidism, unspecified: Secondary | ICD-10-CM | POA: Diagnosis not present

## 2024-05-26 DIAGNOSIS — M25661 Stiffness of right knee, not elsewhere classified: Secondary | ICD-10-CM | POA: Diagnosis not present

## 2024-05-26 DIAGNOSIS — Z96651 Presence of right artificial knee joint: Secondary | ICD-10-CM | POA: Diagnosis not present

## 2024-05-30 DIAGNOSIS — M25561 Pain in right knee: Secondary | ICD-10-CM | POA: Diagnosis not present

## 2024-05-30 DIAGNOSIS — Z96651 Presence of right artificial knee joint: Secondary | ICD-10-CM | POA: Diagnosis not present

## 2024-06-01 DIAGNOSIS — H43813 Vitreous degeneration, bilateral: Secondary | ICD-10-CM | POA: Diagnosis not present

## 2024-06-01 DIAGNOSIS — M25661 Stiffness of right knee, not elsewhere classified: Secondary | ICD-10-CM | POA: Diagnosis not present

## 2024-06-01 DIAGNOSIS — Z96651 Presence of right artificial knee joint: Secondary | ICD-10-CM | POA: Diagnosis not present

## 2024-06-01 DIAGNOSIS — H2513 Age-related nuclear cataract, bilateral: Secondary | ICD-10-CM | POA: Diagnosis not present

## 2024-06-13 DIAGNOSIS — M25561 Pain in right knee: Secondary | ICD-10-CM | POA: Diagnosis not present

## 2024-06-13 DIAGNOSIS — Z96651 Presence of right artificial knee joint: Secondary | ICD-10-CM | POA: Diagnosis not present

## 2024-06-16 DIAGNOSIS — M25661 Stiffness of right knee, not elsewhere classified: Secondary | ICD-10-CM | POA: Diagnosis not present

## 2024-06-16 DIAGNOSIS — Z96651 Presence of right artificial knee joint: Secondary | ICD-10-CM | POA: Diagnosis not present

## 2024-06-21 DIAGNOSIS — Z96651 Presence of right artificial knee joint: Secondary | ICD-10-CM | POA: Diagnosis not present

## 2024-06-21 DIAGNOSIS — M25661 Stiffness of right knee, not elsewhere classified: Secondary | ICD-10-CM | POA: Diagnosis not present

## 2024-08-25 DIAGNOSIS — E039 Hypothyroidism, unspecified: Secondary | ICD-10-CM | POA: Diagnosis not present

## 2024-09-14 ENCOUNTER — Other Ambulatory Visit: Payer: Self-pay | Admitting: Family Medicine

## 2024-09-14 DIAGNOSIS — Z1231 Encounter for screening mammogram for malignant neoplasm of breast: Secondary | ICD-10-CM

## 2024-09-15 DIAGNOSIS — Z6827 Body mass index (BMI) 27.0-27.9, adult: Secondary | ICD-10-CM | POA: Diagnosis not present

## 2024-09-15 DIAGNOSIS — I10 Essential (primary) hypertension: Secondary | ICD-10-CM | POA: Diagnosis not present

## 2024-09-15 DIAGNOSIS — E039 Hypothyroidism, unspecified: Secondary | ICD-10-CM | POA: Diagnosis not present

## 2024-09-15 DIAGNOSIS — Z0000 Encounter for general adult medical examination without abnormal findings: Secondary | ICD-10-CM | POA: Diagnosis not present

## 2024-09-15 DIAGNOSIS — E782 Mixed hyperlipidemia: Secondary | ICD-10-CM | POA: Diagnosis not present

## 2024-09-30 DIAGNOSIS — Z96651 Presence of right artificial knee joint: Secondary | ICD-10-CM | POA: Diagnosis not present

## 2024-11-15 ENCOUNTER — Ambulatory Visit
Admission: RE | Admit: 2024-11-15 | Discharge: 2024-11-15 | Disposition: A | Source: Ambulatory Visit | Attending: Family Medicine | Admitting: Family Medicine

## 2024-11-15 DIAGNOSIS — Z1231 Encounter for screening mammogram for malignant neoplasm of breast: Secondary | ICD-10-CM | POA: Diagnosis present

## 2024-12-12 NOTE — Discharge Instructions (Signed)

## 2024-12-13 ENCOUNTER — Encounter: Payer: Self-pay | Admitting: Ophthalmology

## 2024-12-14 ENCOUNTER — Other Ambulatory Visit: Payer: Self-pay

## 2024-12-14 ENCOUNTER — Ambulatory Visit: Admit: 2024-12-14 | Admitting: Ophthalmology

## 2024-12-14 ENCOUNTER — Ambulatory Visit: Payer: Self-pay

## 2024-12-14 ENCOUNTER — Encounter: Payer: Self-pay | Admitting: Ophthalmology

## 2024-12-14 ENCOUNTER — Ambulatory Visit
Admission: RE | Admit: 2024-12-14 | Discharge: 2024-12-14 | Disposition: A | Discharge status: Home / Self Care | Attending: Ophthalmology | Admitting: Ophthalmology

## 2024-12-14 DIAGNOSIS — H2512 Age-related nuclear cataract, left eye: Secondary | ICD-10-CM | POA: Insufficient documentation

## 2024-12-14 DIAGNOSIS — I10 Essential (primary) hypertension: Secondary | ICD-10-CM | POA: Insufficient documentation

## 2024-12-14 HISTORY — PX: CATARACT EXTRACTION W/PHACO: SHX586

## 2024-12-14 MED ORDER — CEFUROXIME OPHTHALMIC INJECTION 1 MG/0.1 ML
INJECTION | OPHTHALMIC | Status: DC | PRN
  Administered 2024-12-14: 1 mg via INTRACAMERAL

## 2024-12-14 MED ORDER — PHENYLEPHRINE HCL 10 % OP SOLN
OPHTHALMIC | Status: AC
  Filled 2024-12-14: qty 5

## 2024-12-14 MED ORDER — LIDOCAINE HCL (PF) 2 % IJ SOLN
INTRAOCULAR | Status: DC | PRN
  Administered 2024-12-14: 2 mL

## 2024-12-14 MED ORDER — MIDAZOLAM HCL 2 MG/2ML IJ SOLN
INTRAMUSCULAR | Status: AC
  Filled 2024-12-14: qty 2

## 2024-12-14 MED ORDER — TETRACAINE HCL 0.5 % OP SOLN
OPHTHALMIC | Status: AC
  Filled 2024-12-14: qty 4

## 2024-12-14 MED ORDER — BRIMONIDINE TARTRATE-TIMOLOL 0.2-0.5 % OP SOLN
OPHTHALMIC | Status: DC | PRN
  Administered 2024-12-14: 1 [drp] via OPHTHALMIC

## 2024-12-14 MED ORDER — SIGHTPATH DOSE#1 BSS IO SOLN
INTRAOCULAR | Status: DC | PRN
  Administered 2024-12-14: 15 mL via INTRAOCULAR

## 2024-12-14 MED ORDER — CYCLOPENTOLATE HCL 2 % OP SOLN
1.0000 [drp] | OPHTHALMIC | Status: DC | PRN
  Administered 2024-12-14 (×2): 1 [drp] via OPHTHALMIC

## 2024-12-14 MED ORDER — SIGHTPATH DOSE#1 NA HYALUR & NA CHOND-NA HYALUR IO KIT
PACK | INTRAOCULAR | Status: DC | PRN
  Administered 2024-12-14: 1 via OPHTHALMIC

## 2024-12-14 MED ORDER — LACTATED RINGERS IV SOLN: INTRAVENOUS | Status: DC

## 2024-12-14 MED ORDER — FENTANYL CITRATE (PF) 100 MCG/2ML IJ SOLN
INTRAMUSCULAR | Status: AC
  Filled 2024-12-14: qty 2

## 2024-12-14 MED ORDER — CYCLOPENTOLATE HCL 2 % OP SOLN
OPHTHALMIC | Status: AC
  Filled 2024-12-14: qty 2

## 2024-12-14 MED ORDER — TETRACAINE HCL 0.5 % OP SOLN
1.0000 [drp] | OPHTHALMIC | Status: DC | PRN
  Administered 2024-12-14 (×3): 1 [drp] via OPHTHALMIC

## 2024-12-14 MED ORDER — PHENYLEPHRINE HCL 10 % OP SOLN
1.0000 [drp] | OPHTHALMIC | Status: DC | PRN
  Administered 2024-12-14 (×2): 1 [drp] via OPHTHALMIC

## 2024-12-14 MED ORDER — FENTANYL CITRATE (PF) 100 MCG/2ML IJ SOLN
INTRAMUSCULAR | Status: DC | PRN
  Administered 2024-12-14: 50 ug via INTRAVENOUS

## 2024-12-14 MED ORDER — MIDAZOLAM HCL (PF) 2 MG/2ML IJ SOLN
INTRAMUSCULAR | Status: DC | PRN
  Administered 2024-12-14: 2 mg via INTRAVENOUS

## 2024-12-14 MED ORDER — SIGHTPATH DOSE#1 BSS IO SOLN
INTRAOCULAR | Status: DC | PRN
  Administered 2024-12-14: 64 mL via OPHTHALMIC

## 2024-12-14 NOTE — Anesthesia Postprocedure Evaluation (Signed)
"   Anesthesia Post Note  Patient: Audrey Finley  Procedure(s) Performed: PHACOEMULSIFICATION, CATARACT, WITH IOL INSERTION 5.34 00:34.9 (Left: Eye)  Patient location during evaluation: PACU Anesthesia Type: MAC Level of consciousness: awake and alert Pain management: pain level controlled Vital Signs Assessment: post-procedure vital signs reviewed and stable Respiratory status: spontaneous breathing, nonlabored ventilation, respiratory function stable and patient connected to nasal cannula oxygen Cardiovascular status: stable and blood pressure returned to baseline Postop Assessment: no apparent nausea or vomiting Anesthetic complications: no   No notable events documented.   Last Vitals:  Vitals:   12/14/24 1059 12/14/24 1102  BP: 122/80 114/78  Pulse: 71 68  Resp: 12 20  Temp: (!) 36.1 C (!) 36.1 C  SpO2: 99% 97%    Last Pain:  Vitals:   12/14/24 1102  TempSrc:   PainSc: 0-No pain                 Fairy A Mathan Darroch      "

## 2024-12-14 NOTE — H&P (Signed)
 " [facility]   Primary Care Physician:  Valora Lynwood FALCON, MD Ophthalmologist: Dr. Dene Etienne  Pre-Procedure History & Physical: HPI:  Audrey Finley is a 74 y.o. female here for ophthalmic surgery.   Past Medical History:  Diagnosis Date   Acquired hypothyroidism    Chest pain, non-cardiac    Chronic dyspnea    Coronary artery calcification    Degenerative arthritis of right knee    Diverticulosis    GERD (gastroesophageal reflux disease)    Hashimoto's thyroiditis    History of hiatal hernia    Hyperlipidemia 2004   Hyperlipidemia    Osteoporosis    Primary hypertension    Thrombocytopenia    Vitamin B 12 deficiency    Vitamin D deficiency     Past Surgical History:  Procedure Laterality Date   CESAREAN SECTION  05/08/1977   CESAREAN SECTION  06/25/1979   COLONOSCOPY  02/14/2005   COLONOSCOPY WITH PROPOFOL  N/A 07/02/2018   Procedure: COLONOSCOPY WITH PROPOFOL ;  Surgeon: Gaylyn Gladis PENNER, MD;  Location: [facility] ENDOSCOPY;  Service: Endoscopy;  Laterality: N/A;   COLONOSCOPY WITH PROPOFOL  N/A 09/03/2018   Procedure: COLONOSCOPY WITH PROPOFOL ;  Surgeon: Gaylyn Gladis PENNER, MD;  Location: [facility] ENDOSCOPY;  Service: Endoscopy;  Laterality: N/A;   KNEE ARTHROPLASTY Right 03/28/2024   Procedure: ARTHROPLASTY, KNEE, TOTAL, USING IMAGELESS COMPUTER-ASSISTED NAVIGATION;  Surgeon: Mardee Lynwood SQUIBB, MD;  Location: [facility] ORS;  Service: Orthopedics;  Laterality: Right;   TONSILLECTOMY  1963   TOTAL ABDOMINAL HYSTERECTOMY W/ BILATERAL SALPINGOOPHORECTOMY  2001   TUBAL LIGATION  06/25/1979    Prior to Admission medications  Medication Sig Start Date End Date Taking? Authorizing Provider  acetaminophen  (TYLENOL ) 650 MG CR tablet Take 650 mg by mouth every 8 (eight) hours as needed for pain.   Yes [provider]  Ascorbic Acid (VITAMIN C) 500 MG CHEW Chew 1,000 mg by mouth daily.   Yes [provider]  aspirin  EC 81 MG tablet Take 1 tablet (81 mg total)  by mouth 2 (two) times daily. Swallow whole. 03/29/24  Yes Drake Chew, PA-C  atorvastatin  (LIPITOR) 20 MG tablet Take 20 mg by mouth daily.   Yes [provider]  benazepril -hydrochlorthiazide (LOTENSIN  HCT) 20-25 MG tablet Take 1 tablet by mouth daily.   Yes [provider]  busPIRone  (BUSPAR ) 10 MG tablet Take 10 mg by mouth 2 (two) times daily. 11/16/23  Yes [provider]  Cyanocobalamin  ([employer] B12 GUMMIES PO) Take 2 tablets by mouth daily.   Yes [provider]  DHA-EPA-Vitamin E (OMEGA-3 COMPLEX PO) Take 1 tablet by mouth daily.   Yes [provider]  Ginkgo Biloba 40 MG TABS Take 1 tablet by mouth daily.   Yes [provider]  levothyroxine  (SYNTHROID ) 75 MCG tablet Take 75 mcg by mouth daily before breakfast.   Yes [provider]  meloxicam  (MOBIC ) 15 MG tablet Take 1 tablet (15 mg total) by mouth daily. 03/29/24  Yes Drake Chew, PA-C  omeprazole (PRILOSEC) 20 MG capsule Take 20 mg by mouth 2 (two) times daily.   Yes [provider]  OVER THE COUNTER MEDICATION Take 2 tablets by mouth daily. Collagen w/ Biotin gummies   Yes [provider]  phentermine  (ADIPEX-P ) 37.5 MG tablet Take 37.5 mg by mouth every morning.   Yes [provider]  polyethylene glycol (MIRALAX  / GLYCOLAX ) 17 g packet Take 17 g by mouth daily.   Yes [provider]  topiramate  (TOPAMAX ) 50 MG  tablet Take 50 mg by mouth at bedtime. 01/15/24  Yes [provider]  venlafaxine  XR (EFFEXOR -XR) 75 MG 24 hr capsule Take 75 mg by mouth daily. 01/20/24  Yes [provider]  vitamin E 180 MG (400 UNITS) capsule Take 400 Units by mouth daily.   Yes [provider]  docusate sodium  (COLACE) 100 MG capsule Take 100 mg by mouth 2 (two) times daily.    [provider]    Allergies as of 12/05/2024   (No Known Allergies)    Family History  Problem Relation Age of Onset   Diabetes Mother     Coronary artery disease Mother    CVA Mother    Coronary artery disease Father    Kidney disease Father    Hyperlipidemia Sister    Breast cancer Neg Hx     Social History   Socioeconomic History   Marital status: Married    Spouse name: Lamar   Number of children: 2   Years of education: Not on file   Highest education level: Not on file  Occupational History   Not on file  Tobacco Use   Smoking status: Never   Smokeless tobacco: Never  Vaping Use   Vaping status: Never Used  Substance and Sexual Activity   Alcohol use: Yes    Comment: ONCE A MONTH   Drug use: Never   Sexual activity: Yes    Birth control/protection: Surgical  Other Topics Concern   Not on file  Social History Narrative   Not on file   Social Drivers of Health   Tobacco Use: Low Risk (12/14/2024)   Patient History    Smoking Tobacco Use: Never    Smokeless Tobacco Use: Never    Passive Exposure: Not on file  Financial Resource Strain: Low Risk  (09/30/2024)   Received from [facility]   Overall Financial Resource Strain (CARDIA)    Difficulty of Paying Living Expenses: Not hard at all  Food Insecurity: No Food Insecurity (09/30/2024)   Received from [facility]   Epic    Within the past 12 months, you worried that your food would run out before you got the money to buy more.: Never true    Within the past 12 months, the food you bought just didn't last and you didn't have money to get more.: Never true  Transportation Needs: No Transportation Needs (09/30/2024)   Received from [facility] - Transportation    In the past 12 months, has lack of transportation kept you from medical appointments or from getting medications?: No    Lack of Transportation (Non-Medical): No  Physical Activity: Not on file  Stress: Not on file  Social Connections: Socially Integrated (03/28/2024)   Social Connection and Isolation Panel    Frequency  of Communication with Friends and Family: More than three times a week    Frequency of Social Gatherings with Friends and Family: Three times a week    Attends Religious Services: More than 4 times per year    Active Member of Clubs or Organizations: Yes    Attends Banker Meetings: More than 4 times per year    Marital Status: Married  Catering Manager Violence: Not At Risk (03/28/2024)   Humiliation, Afraid, Rape, and Kick questionnaire    Fear of Current or Ex-Partner: No    Emotionally Abused: No    Physically Abused: No    Sexually Abused: No  Depression (PHQ2-9): Not on file  Alcohol Screen: Not on file  Housing: Low Risk  (11/22/2024)   Received from [facility]   Epic    In the last 12 months, was there a time when you were not able to pay the mortgage or rent on time?: No    In the past 12 months, how many times have you moved where you were living?: 0    At any time in the past 12 months, were you homeless or living in a shelter (including now)?: No  Utilities: Not At Risk (09/30/2024)   Received from [facility]   Epic    In the past 12 months has the electric, gas, oil, or water company threatened to shut off services in your home?: No  Health Literacy: Not on file    Review of Systems: See HPI, otherwise negative ROS  Physical Exam: BP 137/76   Pulse 68   Temp 98.7 F (37.1 C) (Temporal)   Resp 16   Ht 5' 6 (1.676 m)   Wt 75.3 kg   SpO2 100%   BMI 26.79 kg/m  General:   Alert,  pleasant and cooperative in NAD Head:  Normocephalic and atraumatic. Lungs:  Clear to auscultation.    Heart:  Regular rate and rhythm.   Impression/Plan: Audrey Finley is here for ophthalmic surgery.  Risks, benefits, limitations, and alternatives regarding ophthalmic surgery have been reviewed with the patient.  Questions have been answered.  All parties agreeable.   MITTIE GASKIN, MD  12/14/2024, 10:06 AM  "

## 2024-12-14 NOTE — Transfer of Care (Signed)
 Immediate Anesthesia Transfer of Care Note  Patient: Audrey Finley  Procedure(s) Performed: PHACOEMULSIFICATION, CATARACT, WITH IOL INSERTION 5.34 00:34.9 (Left: Eye)  Patient Location: PACU  Anesthesia Type: MAC  Level of Consciousness: awake, alert  and patient cooperative  Airway and Oxygen Therapy: Patient Spontanous Breathing and Patient connected to supplemental oxygen  Post-op Assessment: Post-op Vital signs reviewed, Patient's Cardiovascular Status Stable, Respiratory Function Stable, Patent Airway and No signs of Nausea or vomiting  Post-op Vital Signs: Reviewed and stable  Complications: No notable events documented.

## 2024-12-14 NOTE — Op Note (Signed)
 LOCATION:  [facility]   PREOPERATIVE DIAGNOSIS:  Nuclear sclerotic cataract of the left eye.  H25.12  POSTOPERATIVE DIAGNOSIS:  Nuclear sclerotic cataract of the left eye.   PROCEDURE:  Phacoemulsification with Toric posterior chamber intraocular lens placement of the left eye.  Ultrasound time: Procedures: PHACOEMULSIFICATION, CATARACT, WITH IOL INSERTION 5.34 00:34.9 (Left)  LENS:   Implant Name Type Inv. Item Serial No. Manufacturer Lot No. LRB No. Used Action  LENS IOL CLRN TRC 4 19.0 - [id number]  LENS IOL CLRN TRC 4 19.0 [id number] [employer]  Left 1 Implanted     [id number] Toric intraocular lens with 2.25 diopters of cylindrical power with axis orientation at 169 degrees.     SURGEON:  Dene FABIENE Etienne, MD   ANESTHESIA:  Topical with tetracaine  drops and 2% Xylocaine  jelly, augmented with 1% preservative-free intracameral lidocaine .  COMPLICATIONS:  None.   DESCRIPTION OF PROCEDURE:  The patient was identified in the holding room and transported to the operating suite and placed in the supine position under the operating microscope.  The left eye was identified as the operative eye, and it was prepped and draped in the usual sterile ophthalmic fashion.    A clear-corneal paracentesis incision was made at the 1:30 position.  0.5 ml of preservative-free 1% lidocaine  was injected into the anterior chamber. The anterior chamber was filled with Viscoat.  A 2.4 millimeter near clear corneal incision was then made at the 10:30 position.  A cystotome and capsulorrhexis forceps were then used to make a curvilinear capsulorrhexis.  Hydrodissection and hydrodelineation were then performed using balanced salt solution.   Phacoemulsification was then used in stop and chop fashion to remove the lens, nucleus and epinucleus.  The remaining cortex was aspirated using the irrigation and aspiration handpiece.  Provisc viscoelastic was then placed into the capsular bag to distend it  for lens placement.  The Verion digital marker was used to align the implant at the intended axis.   A Toric lens was then injected into the capsular bag.  It was rotated clockwise until the axis marks on the lens were approximately 15 degrees in the counterclockwise direction to the intended alignment.  The viscoelastic was aspirated from the eye using the irrigation aspiration handpiece.  Then, a Koch spatula through the sideport incision was used to rotate the lens in a clockwise direction until the axis markings of the intraocular lens were lined up with the Verion alignment.  Balanced salt solution was then used to hydrate the wounds. Cefuroxime  0.1 ml of a 10mg /ml solution was injected into the anterior chamber for a dose of 1 mg of intracameral antibiotic at the completion of the case.    The eye was noted to have a physiologic pressure and there was no wound leak noted.   Timolol  and Brimonidine  drops were applied to the eye.  The patient was taken to the recovery room in stable condition having had no complications of anesthesia or surgery.  Donabelle Molden 12/14/2024, 10:55 AM

## 2024-12-14 NOTE — Anesthesia Preprocedure Evaluation (Signed)
"                                    Anesthesia Evaluation  Patient identified by MRN, date of birth, ID band Patient awake    Reviewed: Allergy & Precautions, H&P , NPO status , Patient's Chart, lab work & pertinent test results  Airway Mallampati: II  TM Distance: >3 FB Neck ROM: Full    Dental no notable dental hx. (+) Teeth Intact   Pulmonary neg pulmonary ROS   Pulmonary exam normal breath sounds clear to auscultation       Cardiovascular hypertension, negative cardio ROS Normal cardiovascular exam Rhythm:Regular Rate:Normal     Neuro/Psych negative neurological ROS  negative psych ROS   GI/Hepatic negative GI ROS, Neg liver ROS,,,  Endo/Other  negative endocrine ROS    Renal/GU negative Renal ROS  negative genitourinary   Musculoskeletal negative musculoskeletal ROS (+)    Abdominal   Peds negative pediatric ROS (+)  Hematology negative hematology ROS (+)   Anesthesia Other Findings   Reproductive/Obstetrics negative OB ROS                              Anesthesia Physical Anesthesia Plan  ASA: 2  Anesthesia Plan: MAC   Post-op Pain Management:    Induction: Intravenous  PONV Risk Score and Plan: 1 and Ondansetron   Airway Management Planned:   Additional Equipment:   Intra-op Plan:   Post-operative Plan: Extubation in OR  Informed Consent: I have reviewed the patients History and Physical, chart, labs and discussed the procedure including the risks, benefits and alternatives for the proposed anesthesia with the patient or authorized representative who has indicated his/her understanding and acceptance.     Dental advisory given  Plan Discussed with: CRNA  Anesthesia Plan Comments:         Anesthesia Quick Evaluation  "

## 2024-12-16 ENCOUNTER — Other Ambulatory Visit: Payer: Self-pay | Admitting: Neurology

## 2024-12-16 DIAGNOSIS — R413 Other amnesia: Secondary | ICD-10-CM

## 2024-12-27 NOTE — Discharge Instructions (Signed)

## 2024-12-28 ENCOUNTER — Encounter: Admission: RE | Disposition: A | Payer: Self-pay | Source: Home / Self Care | Attending: Ophthalmology

## 2024-12-28 ENCOUNTER — Ambulatory Visit: Payer: Self-pay | Admitting: Anesthesiology

## 2024-12-28 ENCOUNTER — Encounter: Payer: Self-pay | Admitting: Ophthalmology

## 2024-12-28 ENCOUNTER — Other Ambulatory Visit: Payer: Self-pay

## 2024-12-28 ENCOUNTER — Ambulatory Visit
Admission: RE | Admit: 2024-12-28 | Discharge: 2024-12-28 | Disposition: A | Attending: Ophthalmology | Admitting: Ophthalmology

## 2024-12-28 DIAGNOSIS — I10 Essential (primary) hypertension: Secondary | ICD-10-CM | POA: Diagnosis not present

## 2024-12-28 DIAGNOSIS — H2511 Age-related nuclear cataract, right eye: Secondary | ICD-10-CM | POA: Insufficient documentation

## 2024-12-28 MED ORDER — FENTANYL CITRATE (PF) 100 MCG/2ML IJ SOLN
INTRAMUSCULAR | Status: DC | PRN
  Administered 2024-12-28: 50 ug via INTRAVENOUS

## 2024-12-28 MED ORDER — CYCLOPENTOLATE HCL 2 % OP SOLN
1.0000 [drp] | OPHTHALMIC | Status: AC | PRN
  Administered 2024-12-28 (×3): 1 [drp] via OPHTHALMIC

## 2024-12-28 MED ORDER — CEFUROXIME OPHTHALMIC INJECTION 1 MG/0.1 ML
INJECTION | OPHTHALMIC | Status: DC | PRN
  Administered 2024-12-28: 1 mg via INTRACAMERAL

## 2024-12-28 MED ORDER — SIGHTPATH DOSE#1 BSS IO SOLN
INTRAOCULAR | Status: DC | PRN
  Administered 2024-12-28: 15 mL via INTRAOCULAR

## 2024-12-28 MED ORDER — PHENYLEPHRINE HCL 10 % OP SOLN
1.0000 [drp] | OPHTHALMIC | Status: AC | PRN
  Administered 2024-12-28 (×3): 1 [drp] via OPHTHALMIC

## 2024-12-28 MED ORDER — SIGHTPATH DOSE#1 NA HYALUR & NA CHOND-NA HYALUR IO KIT
PACK | INTRAOCULAR | Status: DC | PRN
  Administered 2024-12-28: 1 via OPHTHALMIC

## 2024-12-28 MED ORDER — LACTATED RINGERS IV SOLN: INTRAVENOUS | Status: DC

## 2024-12-28 MED ORDER — MIDAZOLAM HCL 2 MG/2ML IJ SOLN
INTRAMUSCULAR | Status: AC
  Filled 2024-12-28: qty 2

## 2024-12-28 MED ORDER — SIGHTPATH DOSE#1 BSS IO SOLN
INTRAOCULAR | Status: DC | PRN
  Administered 2024-12-28: 69 mL via OPHTHALMIC

## 2024-12-28 MED ORDER — FENTANYL CITRATE (PF) 100 MCG/2ML IJ SOLN
INTRAMUSCULAR | Status: AC
  Filled 2024-12-28: qty 2

## 2024-12-28 MED ORDER — CYCLOPENTOLATE HCL 2 % OP SOLN
OPHTHALMIC | Status: AC
  Filled 2024-12-28: qty 2

## 2024-12-28 MED ORDER — MIDAZOLAM HCL (PF) 2 MG/2ML IJ SOLN
INTRAMUSCULAR | Status: DC | PRN
  Administered 2024-12-28: 2 mg via INTRAVENOUS

## 2024-12-28 MED ORDER — BRIMONIDINE TARTRATE-TIMOLOL 0.2-0.5 % OP SOLN
OPHTHALMIC | Status: DC | PRN
  Administered 2024-12-28: 1 [drp] via OPHTHALMIC

## 2024-12-28 MED ORDER — LIDOCAINE HCL (PF) 2 % IJ SOLN
INTRAOCULAR | Status: DC | PRN
  Administered 2024-12-28: 2 mL

## 2024-12-28 MED ORDER — TETRACAINE HCL 0.5 % OP SOLN
1.0000 [drp] | OPHTHALMIC | Status: DC | PRN
  Administered 2024-12-28 (×3): 1 [drp] via OPHTHALMIC

## 2024-12-28 MED ORDER — PHENYLEPHRINE HCL 10 % OP SOLN
OPHTHALMIC | Status: AC
  Filled 2024-12-28: qty 5

## 2024-12-28 MED ORDER — TETRACAINE HCL 0.5 % OP SOLN
OPHTHALMIC | Status: AC
  Filled 2024-12-28: qty 4

## 2024-12-28 NOTE — H&P (Signed)
 " [facility]   Primary Care Physician:  Valora Lynwood FALCON, MD Ophthalmologist: Dr. Dene Etienne  Pre-Procedure History & Physical: HPI:  Audrey Finley is a 74 y.o. female here for ophthalmic surgery.   Past Medical History:  Diagnosis Date   Acquired hypothyroidism    Chest pain, non-cardiac    Chronic dyspnea    Coronary artery calcification    Degenerative arthritis of right knee    Diverticulosis    GERD (gastroesophageal reflux disease)    Hashimoto's thyroiditis    History of hiatal hernia    Hyperlipidemia 2004   Hyperlipidemia    Osteoporosis    Primary hypertension    Thrombocytopenia    Vitamin B 12 deficiency    Vitamin D deficiency     Past Surgical History:  Procedure Laterality Date   CATARACT EXTRACTION W/PHACO Left 12/14/2024   Procedure: PHACOEMULSIFICATION, CATARACT, WITH IOL INSERTION 5.34 00:34.9;  Surgeon: Etienne Dene, MD;  Location: [facility];  Service: Ophthalmology;  Laterality: Left;   CESAREAN SECTION  05/08/1977   CESAREAN SECTION  06/25/1979   COLONOSCOPY  02/14/2005   COLONOSCOPY WITH PROPOFOL  N/A 07/02/2018   Procedure: COLONOSCOPY WITH PROPOFOL ;  Surgeon: Gaylyn Gladis PENNER, MD;  Location: [facility];  Service: Endoscopy;  Laterality: N/A;   COLONOSCOPY WITH PROPOFOL  N/A 09/03/2018   Procedure: COLONOSCOPY WITH PROPOFOL ;  Surgeon: Gaylyn Gladis PENNER, MD;  Location: [facility] ENDOSCOPY;  Service: Endoscopy;  Laterality: N/A;   KNEE ARTHROPLASTY Right 03/28/2024   Procedure: ARTHROPLASTY, KNEE, TOTAL, USING IMAGELESS COMPUTER-ASSISTED NAVIGATION;  Surgeon: Mardee Lynwood SQUIBB, MD;  Location: [facility] ORS;  Service: Orthopedics;  Laterality: Right;   TONSILLECTOMY  1963   TOTAL ABDOMINAL HYSTERECTOMY W/ BILATERAL SALPINGOOPHORECTOMY  2001   TUBAL LIGATION  06/25/1979    Prior to Admission medications  Medication Sig Start Date End Date Taking? Authorizing Provider  acetaminophen  (TYLENOL ) 650 MG CR tablet Take 650 mg  by mouth every 8 (eight) hours as needed for pain.   Yes [provider]  Ascorbic Acid (VITAMIN C) 500 MG CHEW Chew 1,000 mg by mouth daily.   Yes [provider]  aspirin  EC 81 MG tablet Take 1 tablet (81 mg total) by mouth 2 (two) times daily. Swallow whole. 03/29/24  Yes Drake Chew, PA-C  atorvastatin  (LIPITOR) 20 MG tablet Take 20 mg by mouth daily.   Yes [provider]  benazepril -hydrochlorthiazide (LOTENSIN  HCT) 20-25 MG tablet Take 1 tablet by mouth daily.   Yes [provider]  busPIRone  (BUSPAR ) 10 MG tablet Take 10 mg by mouth 2 (two) times daily. 11/16/23  Yes [provider]  Cyanocobalamin  ([employer] B12 GUMMIES PO) Take 2 tablets by mouth daily.   Yes [provider]  DHA-EPA-Vitamin E (OMEGA-3 COMPLEX PO) Take 1 tablet by mouth daily.   Yes [provider]  docusate sodium  (COLACE) 100 MG capsule Take 100 mg by mouth 2 (two) times daily.   Yes [provider]  Ginkgo Biloba 40 MG TABS Take 1 tablet by mouth daily.   Yes [provider]  levothyroxine  (SYNTHROID ) 75 MCG tablet Take 75 mcg by mouth daily before breakfast.   Yes [provider]  meloxicam  (MOBIC ) 15 MG tablet Take 1 tablet (15 mg total) by mouth daily. 03/29/24  Yes Drake Chew, PA-C  omeprazole (PRILOSEC) 20 MG capsule Take 20 mg by mouth 2 (two) times daily.   Yes [provider]  phentermine  (ADIPEX-P ) 37.5 MG tablet Take 37.5 mg by mouth  every morning.   Yes [provider]  polyethylene glycol (MIRALAX  / GLYCOLAX ) 17 g packet Take 17 g by mouth daily.   Yes [provider]  topiramate  (TOPAMAX ) 50 MG tablet Take 50 mg by mouth at bedtime. 01/15/24  Yes [provider]  venlafaxine  XR (EFFEXOR -XR) 75 MG 24 hr capsule Take 75 mg by mouth daily. 01/20/24  Yes [provider]  vitamin E 180 MG (400 UNITS) capsule Take 400 Units by mouth daily.   Yes [provider]  OVER  THE COUNTER MEDICATION Take 2 tablets by mouth daily. Collagen w/ Biotin gummies    [provider]    Allergies as of 12/20/2024   (No Known Allergies)    Family History  Problem Relation Age of Onset   Diabetes Mother    Coronary artery disease Mother    CVA Mother    Coronary artery disease Father    Kidney disease Father    Hyperlipidemia Sister    Breast cancer Neg Hx     Social History   Socioeconomic History   Marital status: Married    Spouse name: Lamar   Number of children: 2   Years of education: Not on file   Highest education level: Not on file  Occupational History   Not on file  Tobacco Use   Smoking status: Never   Smokeless tobacco: Never  Vaping Use   Vaping status: Never Used  Substance and Sexual Activity   Alcohol use: Yes    Comment: ONCE A MONTH   Drug use: Never   Sexual activity: Yes    Birth control/protection: Surgical  Other Topics Concern   Not on file  Social History Narrative   Not on file   Social Drivers of Health   Tobacco Use: Low Risk  (12/19/2024)   Received from [facility]   Patient History    Smoking Tobacco Use: Never    Smokeless Tobacco Use: Never    Passive Exposure: Not on file  Financial Resource Strain: Low Risk  (12/15/2024)   Received from [facility]   Overall Financial Resource Strain (CARDIA)    Difficulty of Paying Living Expenses: Not hard at all  Food Insecurity: No Food Insecurity (12/15/2024)   Received from [facility]   Epic    Within the past 12 months, you worried that your food would run out before you got the money to buy more.: Never true    Within the past 12 months, the food you bought just didn't last and you didn't have money to get more.: Never true  Transportation Needs: No Transportation Needs (12/15/2024)   [facility]    In the past 12 months, has lack of  transportation kept you from medical appointments or from getting medications?: No    Lack of Transportation (Non-Medical): No  Physical Activity: Not on file  Stress: Not on file  Social Connections: Socially Integrated (03/28/2024)   Social Connection and Isolation Panel    Frequency of Communication with Friends and Family: More than three times a week    Frequency of Social Gatherings with Friends and Family: Three times a week    Attends Religious Services: More than 4 times per year    Active Member of Clubs or Organizations: Yes    Attends Banker Meetings: More than 4 times per year    Marital Status: Married  Intimate Partner Violence: Not At Risk (03/28/2024)   Humiliation, Afraid, Rape, and Kick questionnaire    Fear of Current or Ex-Partner: No    Emotionally Abused: No    Physically Abused: No    Sexually Abused: No  Depression (PHQ2-9): Not on file  Alcohol Screen: Not on file  Housing: Low Risk  (12/15/2024)   Received from [facility]   Epic    In the last 12 months, was there a time when you were not able to pay the mortgage or rent on time?: No    In the past 12 months, how many times have you moved where you were living?: 0    At any time in the past 12 months, were you homeless or living in a shelter (including now)?: No  Utilities: Not At Risk (12/15/2024)   Received from [facility]   Epic    In the past 12 months has the electric, gas, oil, or water company threatened to shut off services in your home?: No  Health Literacy: Not on file    Review of Systems: See HPI, otherwise negative ROS  Physical Exam: BP 105/87   Pulse 91   Temp (!) 97.4 F (36.3 C) (Temporal)   Resp 15   Ht 5' 6 (1.676 m)   Wt 73.9 kg   SpO2 95%   BMI 26.31 kg/m  General:   Alert,  pleasant and cooperative in NAD Head:  Normocephalic and atraumatic. Lungs:  Clear to auscultation.    Heart:  Regular rate and rhythm.    Impression/Plan: Audrey Finley is here for ophthalmic surgery.  Risks, benefits, limitations, and alternatives regarding ophthalmic surgery have been reviewed with the patient.  Questions have been answered.  All parties agreeable.   MITTIE GASKIN, MD  12/28/2024, 10:08 AM  "

## 2024-12-28 NOTE — Anesthesia Postprocedure Evaluation (Signed)
"   Anesthesia Post Note  Patient: Audrey Finley  Procedure(s) Performed: PHACOEMULSIFICATION, CATARACT, WITH IOL INSERTION 5.78 00:34.4 (Right: Eye)  Patient location during evaluation: PACU Anesthesia Type: MAC Level of consciousness: awake and alert Pain management: pain level controlled Vital Signs Assessment: post-procedure vital signs reviewed and stable Respiratory status: spontaneous breathing, nonlabored ventilation, respiratory function stable and patient connected to nasal cannula oxygen Cardiovascular status: blood pressure returned to baseline and stable Postop Assessment: no apparent nausea or vomiting Anesthetic complications: no   No notable events documented.   Last Vitals:  Vitals:   12/28/24 1048 12/28/24 1051  BP: 99/89   Pulse: 81 78  Resp: 13 14  Temp: (!) 36.3 C (!) 36.3 C  SpO2: 97% 93%    Last Pain:  Vitals:   12/28/24 1051  TempSrc:   PainSc: 0-No pain                 Fairy A Broderic Bara      "

## 2024-12-28 NOTE — Transfer of Care (Signed)
 Immediate Anesthesia Transfer of Care Note  Patient: Audrey Finley  Procedure(s) Performed: PHACOEMULSIFICATION, CATARACT, WITH IOL INSERTION 5.78 00:34.4 (Right: Eye)  Patient Location: PACU  Anesthesia Type: MAC  Level of Consciousness: awake, alert  and patient cooperative  Airway and Oxygen Therapy: Patient Spontanous Breathing and Patient connected to supplemental oxygen  Post-op Assessment: Post-op Vital signs reviewed, Patient's Cardiovascular Status Stable, Respiratory Function Stable, Patent Airway and No signs of Nausea or vomiting  Post-op Vital Signs: Reviewed and stable  Complications: No notable events documented.

## 2024-12-28 NOTE — Op Note (Signed)
 LOCATION:  [facility]   PREOPERATIVE DIAGNOSIS:  Nuclear sclerotic cataract of the right eye.  H25.11   POSTOPERATIVE DIAGNOSIS:  Nuclear sclerotic cataract of the right eye.   PROCEDURE:  Phacoemulsification with Toric posterior chamber intraocular lens placement of the right eye.  Ultrasound time: Procedures: PHACOEMULSIFICATION, CATARACT, WITH IOL INSERTION 5.78 00:34.4 (Right)  LENS:   Implant Name Type Inv. Item Serial No. Manufacturer Lot No. LRB No. Used Action  LENS IOL PANO PRO TORC 15.5 - [id number]  LENS IOL PANO PRO TORC 15.5 [id number] [employer]  Right 1 Implanted     [id number] [place] (NOT [employer]opt[employer]) Toric intraocular lens with 1.5 diopters of cylindrical power with axis orientation at 69 degrees.   SURGEON:  Dene FABIENE Etienne, MD   ANESTHESIA: Topical with tetracaine  drops and 2% Xylocaine  jelly, augmented with 1% preservative-free intracameral lidocaine . .   COMPLICATIONS:  None.   DESCRIPTION OF PROCEDURE:  The patient was identified in the holding room and transported to the operating suite and placed in the supine position under the operating microscope.  The right eye was identified as the operative eye, and it was prepped and draped in the usual sterile ophthalmic fashion.    A clear-corneal paracentesis incision was made at the 12:00 position.  0.5 ml of preservative-free 1% lidocaine  was injected into the anterior chamber. The anterior chamber was filled with Viscoat.  A 2.4 millimeter near clear corneal incision was then made at the 9:00 position.  A cystotome and capsulorrhexis forceps were then used to make a curvilinear capsulorrhexis.  Hydrodissection and hydrodelineation were then performed using balanced salt solution.   Phacoemulsification was then used in stop and chop fashion to remove the lens, nucleus and epinucleus.  The remaining cortex was aspirated using the irrigation and aspiration handpiece.  Provisc viscoelastic was then placed  into the capsular bag to distend it for lens placement.  The Verion digital marker was used to align the implant at the intended axis.   A Toric lens was then injected into the capsular bag.  It was rotated clockwise until the axis marks on the lens were approximately 15 degrees in the counterclockwise direction to the intended alignment.  The viscoelastic was aspirated from the eye using the irrigation aspiration handpiece.  Then, a Koch spatula through the sideport incision was used to rotate the lens in a clockwise direction until the axis markings of the intraocular lens were lined up with the Verion alignment.  Balanced salt solution was then used to hydrate the wounds. Cefuroxime  0.1 ml of a 10mg /ml solution was injected into the anterior chamber for a dose of 1 mg of intracameral antibiotic at the completion of the case.    The eye was noted to have a physiologic pressure and there was no wound leak noted.   Timolol  and Brimonidine  drops and Maxitrol ointment were applied to the eye.  The patient was taken to the recovery room in stable condition having had no complications of anesthesia or surgery.  Janeane Cozart 12/28/2024, 10:46 AM

## 2024-12-28 NOTE — Anesthesia Preprocedure Evaluation (Signed)
"                                    Anesthesia Evaluation  Patient identified by MRN, date of birth, ID band Patient awake    Reviewed: Allergy & Precautions, H&P , NPO status , Patient's Chart, lab work & pertinent test results  Airway Mallampati: II  TM Distance: >3 FB Neck ROM: Full    Dental no notable dental hx.    Pulmonary neg pulmonary ROS   Pulmonary exam normal breath sounds clear to auscultation       Cardiovascular hypertension, negative cardio ROS Normal cardiovascular exam Rhythm:Regular Rate:Normal     Neuro/Psych negative neurological ROS  negative psych ROS   GI/Hepatic negative GI ROS, Neg liver ROS,,,  Endo/Other  negative endocrine ROS    Renal/GU negative Renal ROS  negative genitourinary   Musculoskeletal negative musculoskeletal ROS (+)    Abdominal   Peds negative pediatric ROS (+)  Hematology negative hematology ROS (+)   Anesthesia Other Findings   Reproductive/Obstetrics negative OB ROS                              Anesthesia Physical Anesthesia Plan  ASA: 2  Anesthesia Plan: MAC   Post-op Pain Management:    Induction: Intravenous  PONV Risk Score and Plan:   Airway Management Planned:   Additional Equipment:   Intra-op Plan:   Post-operative Plan: Extubation in OR  Informed Consent: I have reviewed the patients History and Physical, chart, labs and discussed the procedure including the risks, benefits and alternatives for the proposed anesthesia with the patient or authorized representative who has indicated his/her understanding and acceptance.     Dental advisory given  Plan Discussed with: CRNA  Anesthesia Plan Comments:         Anesthesia Quick Evaluation  "

## 2025-01-09 ENCOUNTER — Ambulatory Visit

## 2025-01-26 ENCOUNTER — Ambulatory Visit

## 2025-03-15 ENCOUNTER — Encounter: Admission: RE | Disposition: A | Payer: Self-pay | Attending: Ophthalmology
